# Patient Record
Sex: Female | Born: 1935
Health system: Southern US, Community
[De-identification: ages and names within clinical notes are randomized; demographics above are authoritative.]

## PROBLEM LIST (undated history)

## (undated) DIAGNOSIS — K219 Gastro-esophageal reflux disease without esophagitis: Secondary | ICD-10-CM

## (undated) DIAGNOSIS — T7840XA Allergy, unspecified, initial encounter: Secondary | ICD-10-CM

## (undated) HISTORY — PX: ABDOMINAL HYSTERECTOMY: SHX81

## (undated) HISTORY — PX: ESOPHAGEAL DILATION: SHX303

## (undated) HISTORY — PX: OTHER SURGICAL HISTORY: SHX169

## (undated) HISTORY — DX: Allergy, unspecified, initial encounter: T78.40XA

## (undated) HISTORY — PX: TONSILLECTOMY: SUR1361

---
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
## 2004-08-30 ENCOUNTER — Ambulatory Visit: Payer: Self-pay | Admitting: Internal Medicine

## 2007-12-10 ENCOUNTER — Ambulatory Visit: Payer: Self-pay | Admitting: Gastroenterology

## 2008-03-22 ENCOUNTER — Ambulatory Visit: Payer: Self-pay | Admitting: Internal Medicine

## 2008-04-14 ENCOUNTER — Ambulatory Visit: Payer: Self-pay | Admitting: Gastroenterology

## 2008-09-30 ENCOUNTER — Ambulatory Visit: Payer: Self-pay | Admitting: Family Medicine

## 2010-04-22 ENCOUNTER — Ambulatory Visit: Payer: Self-pay | Admitting: Family Medicine

## 2010-06-23 ENCOUNTER — Ambulatory Visit: Payer: Self-pay | Admitting: Unknown Physician Specialty

## 2010-10-21 ENCOUNTER — Ambulatory Visit: Admit: 2010-10-21 | Payer: Self-pay | Admitting: Internal Medicine

## 2010-11-06 ENCOUNTER — Ambulatory Visit: Payer: Self-pay | Admitting: Internal Medicine

## 2011-09-16 ENCOUNTER — Ambulatory Visit: Payer: Self-pay

## 2011-09-20 ENCOUNTER — Ambulatory Visit: Payer: Self-pay | Admitting: Internal Medicine

## 2011-10-12 DIAGNOSIS — L820 Inflamed seborrheic keratosis: Secondary | ICD-10-CM | POA: Diagnosis not present

## 2011-10-12 DIAGNOSIS — D485 Neoplasm of uncertain behavior of skin: Secondary | ICD-10-CM | POA: Diagnosis not present

## 2011-11-22 DIAGNOSIS — R944 Abnormal results of kidney function studies: Secondary | ICD-10-CM | POA: Diagnosis not present

## 2011-11-22 DIAGNOSIS — D649 Anemia, unspecified: Secondary | ICD-10-CM | POA: Diagnosis not present

## 2012-04-08 ENCOUNTER — Ambulatory Visit: Payer: Self-pay

## 2012-04-08 DIAGNOSIS — R21 Rash and other nonspecific skin eruption: Secondary | ICD-10-CM | POA: Diagnosis not present

## 2012-04-08 DIAGNOSIS — Z9889 Other specified postprocedural states: Secondary | ICD-10-CM | POA: Diagnosis not present

## 2012-04-08 DIAGNOSIS — K219 Gastro-esophageal reflux disease without esophagitis: Secondary | ICD-10-CM | POA: Diagnosis not present

## 2012-04-08 DIAGNOSIS — W57XXXA Bitten or stung by nonvenomous insect and other nonvenomous arthropods, initial encounter: Secondary | ICD-10-CM | POA: Diagnosis not present

## 2012-04-08 DIAGNOSIS — Z79899 Other long term (current) drug therapy: Secondary | ICD-10-CM | POA: Diagnosis not present

## 2012-04-08 LAB — CBC WITH DIFFERENTIAL/PLATELET
Eosinophil #: 0.2 10*3/uL (ref 0.0–0.7)
Lymphocyte #: 1.2 10*3/uL (ref 1.0–3.6)
Lymphocyte %: 34.6 %
MCHC: 33.2 g/dL (ref 32.0–36.0)
MCV: 93 fL (ref 80–100)
Monocyte #: 0.4 x10 3/mm (ref 0.2–0.9)
Monocyte %: 11.9 %
Neutrophil #: 1.7 10*3/uL (ref 1.4–6.5)
Neutrophil %: 48.1 %
Platelet: 157 10*3/uL (ref 150–440)
RDW: 13.7 % (ref 11.5–14.5)
WBC: 3.5 10*3/uL — ABNORMAL LOW (ref 3.6–11.0)

## 2012-06-20 DIAGNOSIS — R799 Abnormal finding of blood chemistry, unspecified: Secondary | ICD-10-CM | POA: Diagnosis not present

## 2012-06-20 DIAGNOSIS — N760 Acute vaginitis: Secondary | ICD-10-CM | POA: Diagnosis not present

## 2012-06-20 DIAGNOSIS — Z1239 Encounter for other screening for malignant neoplasm of breast: Secondary | ICD-10-CM | POA: Diagnosis not present

## 2012-06-20 DIAGNOSIS — R109 Unspecified abdominal pain: Secondary | ICD-10-CM | POA: Diagnosis not present

## 2012-06-23 ENCOUNTER — Ambulatory Visit: Payer: Self-pay | Admitting: Internal Medicine

## 2012-06-23 DIAGNOSIS — L298 Other pruritus: Secondary | ICD-10-CM | POA: Diagnosis not present

## 2012-06-27 ENCOUNTER — Ambulatory Visit: Payer: Self-pay | Admitting: Family Medicine

## 2012-06-27 DIAGNOSIS — Z1231 Encounter for screening mammogram for malignant neoplasm of breast: Secondary | ICD-10-CM | POA: Diagnosis not present

## 2012-06-27 DIAGNOSIS — R928 Other abnormal and inconclusive findings on diagnostic imaging of breast: Secondary | ICD-10-CM | POA: Diagnosis not present

## 2012-07-09 DIAGNOSIS — R109 Unspecified abdominal pain: Secondary | ICD-10-CM | POA: Diagnosis not present

## 2012-10-22 ENCOUNTER — Ambulatory Visit: Payer: Self-pay | Admitting: Emergency Medicine

## 2012-10-22 DIAGNOSIS — Z9889 Other specified postprocedural states: Secondary | ICD-10-CM | POA: Diagnosis not present

## 2012-10-22 DIAGNOSIS — J069 Acute upper respiratory infection, unspecified: Secondary | ICD-10-CM | POA: Diagnosis not present

## 2012-10-22 DIAGNOSIS — R05 Cough: Secondary | ICD-10-CM | POA: Diagnosis not present

## 2012-10-22 DIAGNOSIS — K219 Gastro-esophageal reflux disease without esophagitis: Secondary | ICD-10-CM | POA: Diagnosis not present

## 2012-10-22 DIAGNOSIS — Z79899 Other long term (current) drug therapy: Secondary | ICD-10-CM | POA: Diagnosis not present

## 2013-05-28 DIAGNOSIS — H26019 Infantile and juvenile cortical, lamellar, or zonular cataract, unspecified eye: Secondary | ICD-10-CM | POA: Diagnosis not present

## 2013-06-04 ENCOUNTER — Ambulatory Visit: Payer: Self-pay

## 2013-06-04 DIAGNOSIS — K219 Gastro-esophageal reflux disease without esophagitis: Secondary | ICD-10-CM | POA: Diagnosis not present

## 2013-06-04 DIAGNOSIS — J40 Bronchitis, not specified as acute or chronic: Secondary | ICD-10-CM | POA: Diagnosis not present

## 2013-06-04 DIAGNOSIS — J309 Allergic rhinitis, unspecified: Secondary | ICD-10-CM | POA: Diagnosis not present

## 2013-06-04 DIAGNOSIS — Z79899 Other long term (current) drug therapy: Secondary | ICD-10-CM | POA: Diagnosis not present

## 2013-06-04 DIAGNOSIS — J069 Acute upper respiratory infection, unspecified: Secondary | ICD-10-CM | POA: Diagnosis not present

## 2013-06-04 DIAGNOSIS — Z9089 Acquired absence of other organs: Secondary | ICD-10-CM | POA: Diagnosis not present

## 2013-08-20 DIAGNOSIS — Z13220 Encounter for screening for lipoid disorders: Secondary | ICD-10-CM | POA: Diagnosis not present

## 2013-08-20 DIAGNOSIS — Z79899 Other long term (current) drug therapy: Secondary | ICD-10-CM | POA: Diagnosis not present

## 2013-08-20 DIAGNOSIS — Z1239 Encounter for other screening for malignant neoplasm of breast: Secondary | ICD-10-CM | POA: Diagnosis not present

## 2013-08-20 DIAGNOSIS — Z780 Asymptomatic menopausal state: Secondary | ICD-10-CM | POA: Diagnosis not present

## 2013-08-20 DIAGNOSIS — K219 Gastro-esophageal reflux disease without esophagitis: Secondary | ICD-10-CM | POA: Diagnosis not present

## 2013-08-20 DIAGNOSIS — Z0000 Encounter for general adult medical examination without abnormal findings: Secondary | ICD-10-CM | POA: Diagnosis not present

## 2013-08-23 DIAGNOSIS — E875 Hyperkalemia: Secondary | ICD-10-CM | POA: Diagnosis not present

## 2013-09-03 ENCOUNTER — Ambulatory Visit: Payer: Self-pay | Admitting: Family Medicine

## 2013-09-03 DIAGNOSIS — Z1231 Encounter for screening mammogram for malignant neoplasm of breast: Secondary | ICD-10-CM | POA: Diagnosis not present

## 2013-10-22 DIAGNOSIS — R7989 Other specified abnormal findings of blood chemistry: Secondary | ICD-10-CM | POA: Diagnosis not present

## 2013-11-05 DIAGNOSIS — R1013 Epigastric pain: Secondary | ICD-10-CM | POA: Diagnosis not present

## 2014-01-07 DIAGNOSIS — K3189 Other diseases of stomach and duodenum: Secondary | ICD-10-CM | POA: Diagnosis not present

## 2014-04-21 DIAGNOSIS — R7989 Other specified abnormal findings of blood chemistry: Secondary | ICD-10-CM | POA: Diagnosis not present

## 2014-06-13 DIAGNOSIS — M899 Disorder of bone, unspecified: Secondary | ICD-10-CM | POA: Diagnosis not present

## 2014-06-13 DIAGNOSIS — N760 Acute vaginitis: Secondary | ICD-10-CM | POA: Diagnosis not present

## 2014-06-13 DIAGNOSIS — H73819 Atrophic flaccid tympanic membrane, unspecified ear: Secondary | ICD-10-CM | POA: Diagnosis not present

## 2014-06-13 DIAGNOSIS — M949 Disorder of cartilage, unspecified: Secondary | ICD-10-CM | POA: Diagnosis not present

## 2014-06-13 DIAGNOSIS — N189 Chronic kidney disease, unspecified: Secondary | ICD-10-CM | POA: Diagnosis not present

## 2014-06-13 DIAGNOSIS — J309 Allergic rhinitis, unspecified: Secondary | ICD-10-CM | POA: Diagnosis not present

## 2014-06-13 DIAGNOSIS — R300 Dysuria: Secondary | ICD-10-CM | POA: Diagnosis not present

## 2014-06-23 DIAGNOSIS — N952 Postmenopausal atrophic vaginitis: Secondary | ICD-10-CM | POA: Diagnosis not present

## 2014-06-23 DIAGNOSIS — N189 Chronic kidney disease, unspecified: Secondary | ICD-10-CM | POA: Diagnosis not present

## 2014-06-23 DIAGNOSIS — M899 Disorder of bone, unspecified: Secondary | ICD-10-CM | POA: Diagnosis not present

## 2014-06-23 DIAGNOSIS — E559 Vitamin D deficiency, unspecified: Secondary | ICD-10-CM | POA: Diagnosis not present

## 2014-06-23 DIAGNOSIS — B373 Candidiasis of vulva and vagina: Secondary | ICD-10-CM | POA: Diagnosis not present

## 2014-06-23 DIAGNOSIS — M949 Disorder of cartilage, unspecified: Secondary | ICD-10-CM | POA: Diagnosis not present

## 2014-06-23 DIAGNOSIS — D518 Other vitamin B12 deficiency anemias: Secondary | ICD-10-CM | POA: Diagnosis not present

## 2014-06-23 DIAGNOSIS — D649 Anemia, unspecified: Secondary | ICD-10-CM | POA: Diagnosis not present

## 2014-06-23 DIAGNOSIS — Z0000 Encounter for general adult medical examination without abnormal findings: Secondary | ICD-10-CM | POA: Diagnosis not present

## 2014-06-23 DIAGNOSIS — B3731 Acute candidiasis of vulva and vagina: Secondary | ICD-10-CM | POA: Diagnosis not present

## 2014-07-16 DIAGNOSIS — E2839 Other primary ovarian failure: Secondary | ICD-10-CM | POA: Diagnosis not present

## 2014-07-16 DIAGNOSIS — M899 Disorder of bone, unspecified: Secondary | ICD-10-CM | POA: Diagnosis not present

## 2014-07-16 DIAGNOSIS — M949 Disorder of cartilage, unspecified: Secondary | ICD-10-CM | POA: Diagnosis not present

## 2014-08-13 DIAGNOSIS — H25019 Cortical age-related cataract, unspecified eye: Secondary | ICD-10-CM | POA: Diagnosis not present

## 2014-09-12 ENCOUNTER — Ambulatory Visit: Payer: Self-pay | Admitting: Family Medicine

## 2014-09-12 DIAGNOSIS — Z885 Allergy status to narcotic agent status: Secondary | ICD-10-CM | POA: Diagnosis not present

## 2014-09-12 DIAGNOSIS — Z884 Allergy status to anesthetic agent status: Secondary | ICD-10-CM | POA: Diagnosis not present

## 2014-09-12 DIAGNOSIS — Z79899 Other long term (current) drug therapy: Secondary | ICD-10-CM | POA: Diagnosis not present

## 2014-09-12 DIAGNOSIS — Z883 Allergy status to other anti-infective agents status: Secondary | ICD-10-CM | POA: Diagnosis not present

## 2014-09-12 DIAGNOSIS — J309 Allergic rhinitis, unspecified: Secondary | ICD-10-CM | POA: Diagnosis not present

## 2014-09-12 DIAGNOSIS — K210 Gastro-esophageal reflux disease with esophagitis: Secondary | ICD-10-CM | POA: Diagnosis not present

## 2014-09-12 DIAGNOSIS — Z8709 Personal history of other diseases of the respiratory system: Secondary | ICD-10-CM | POA: Diagnosis not present

## 2014-09-12 DIAGNOSIS — Z9889 Other specified postprocedural states: Secondary | ICD-10-CM | POA: Diagnosis not present

## 2014-10-06 ENCOUNTER — Ambulatory Visit: Payer: Self-pay | Admitting: Physician Assistant

## 2014-10-06 DIAGNOSIS — J309 Allergic rhinitis, unspecified: Secondary | ICD-10-CM | POA: Diagnosis not present

## 2014-10-06 DIAGNOSIS — M810 Age-related osteoporosis without current pathological fracture: Secondary | ICD-10-CM | POA: Diagnosis not present

## 2014-10-06 DIAGNOSIS — Z1231 Encounter for screening mammogram for malignant neoplasm of breast: Secondary | ICD-10-CM | POA: Diagnosis not present

## 2014-10-06 DIAGNOSIS — D631 Anemia in chronic kidney disease: Secondary | ICD-10-CM | POA: Diagnosis not present

## 2014-10-06 DIAGNOSIS — B373 Candidiasis of vulva and vagina: Secondary | ICD-10-CM | POA: Diagnosis not present

## 2015-01-13 DIAGNOSIS — E559 Vitamin D deficiency, unspecified: Secondary | ICD-10-CM | POA: Diagnosis not present

## 2015-01-13 DIAGNOSIS — N189 Chronic kidney disease, unspecified: Secondary | ICD-10-CM | POA: Diagnosis not present

## 2015-01-13 DIAGNOSIS — J309 Allergic rhinitis, unspecified: Secondary | ICD-10-CM | POA: Diagnosis not present

## 2015-01-13 DIAGNOSIS — S134XXA Sprain of ligaments of cervical spine, initial encounter: Secondary | ICD-10-CM | POA: Diagnosis not present

## 2015-01-13 DIAGNOSIS — R1013 Epigastric pain: Secondary | ICD-10-CM | POA: Diagnosis not present

## 2015-01-13 DIAGNOSIS — N771 Vaginitis, vulvitis and vulvovaginitis in diseases classified elsewhere: Secondary | ICD-10-CM | POA: Diagnosis not present

## 2015-01-13 DIAGNOSIS — M810 Age-related osteoporosis without current pathological fracture: Secondary | ICD-10-CM | POA: Diagnosis not present

## 2015-01-13 DIAGNOSIS — N952 Postmenopausal atrophic vaginitis: Secondary | ICD-10-CM | POA: Diagnosis not present

## 2015-02-13 DIAGNOSIS — N952 Postmenopausal atrophic vaginitis: Secondary | ICD-10-CM | POA: Diagnosis not present

## 2015-02-13 DIAGNOSIS — B373 Candidiasis of vulva and vagina: Secondary | ICD-10-CM | POA: Diagnosis not present

## 2015-02-13 DIAGNOSIS — N183 Chronic kidney disease, stage 3 (moderate): Secondary | ICD-10-CM | POA: Diagnosis not present

## 2015-03-05 DIAGNOSIS — N183 Chronic kidney disease, stage 3 (moderate): Secondary | ICD-10-CM | POA: Diagnosis not present

## 2015-04-02 DIAGNOSIS — N8320 Unspecified ovarian cysts: Secondary | ICD-10-CM | POA: Diagnosis not present

## 2015-05-11 DIAGNOSIS — Z0001 Encounter for general adult medical examination with abnormal findings: Secondary | ICD-10-CM | POA: Diagnosis not present

## 2015-05-11 DIAGNOSIS — B373 Candidiasis of vulva and vagina: Secondary | ICD-10-CM | POA: Diagnosis not present

## 2015-05-11 DIAGNOSIS — N952 Postmenopausal atrophic vaginitis: Secondary | ICD-10-CM | POA: Diagnosis not present

## 2015-05-11 DIAGNOSIS — N8320 Unspecified ovarian cysts: Secondary | ICD-10-CM | POA: Diagnosis not present

## 2015-05-11 DIAGNOSIS — N183 Chronic kidney disease, stage 3 (moderate): Secondary | ICD-10-CM | POA: Diagnosis not present

## 2015-05-11 DIAGNOSIS — M810 Age-related osteoporosis without current pathological fracture: Secondary | ICD-10-CM | POA: Diagnosis not present

## 2015-05-27 DIAGNOSIS — N8320 Unspecified ovarian cysts: Secondary | ICD-10-CM | POA: Diagnosis not present

## 2015-06-15 DIAGNOSIS — N183 Chronic kidney disease, stage 3 (moderate): Secondary | ICD-10-CM | POA: Diagnosis not present

## 2015-06-15 DIAGNOSIS — N8320 Unspecified ovarian cysts: Secondary | ICD-10-CM | POA: Diagnosis not present

## 2015-06-15 DIAGNOSIS — D631 Anemia in chronic kidney disease: Secondary | ICD-10-CM | POA: Diagnosis not present

## 2015-07-29 DIAGNOSIS — D559 Anemia due to enzyme disorder, unspecified: Secondary | ICD-10-CM | POA: Diagnosis not present

## 2015-07-29 DIAGNOSIS — D631 Anemia in chronic kidney disease: Secondary | ICD-10-CM | POA: Diagnosis not present

## 2015-07-29 DIAGNOSIS — Z0001 Encounter for general adult medical examination with abnormal findings: Secondary | ICD-10-CM | POA: Diagnosis not present

## 2015-07-29 DIAGNOSIS — N183 Chronic kidney disease, stage 3 (moderate): Secondary | ICD-10-CM | POA: Diagnosis not present

## 2015-07-29 DIAGNOSIS — E0781 Sick-euthyroid syndrome: Secondary | ICD-10-CM | POA: Diagnosis not present

## 2015-07-29 DIAGNOSIS — E756 Lipid storage disorder, unspecified: Secondary | ICD-10-CM | POA: Diagnosis not present

## 2015-07-29 DIAGNOSIS — E079 Disorder of thyroid, unspecified: Secondary | ICD-10-CM | POA: Diagnosis not present

## 2015-07-29 DIAGNOSIS — E538 Deficiency of other specified B group vitamins: Secondary | ICD-10-CM | POA: Diagnosis not present

## 2015-09-24 ENCOUNTER — Ambulatory Visit
Admission: EM | Admit: 2015-09-24 | Discharge: 2015-09-24 | Disposition: A | Discharge status: Home / Self Care | Payer: Medicare Other | Attending: Family Medicine | Admitting: Family Medicine

## 2015-09-24 DIAGNOSIS — R05 Cough: Secondary | ICD-10-CM | POA: Diagnosis not present

## 2015-09-24 DIAGNOSIS — R059 Cough, unspecified: Secondary | ICD-10-CM

## 2015-09-24 DIAGNOSIS — H00013 Hordeolum externum right eye, unspecified eyelid: Secondary | ICD-10-CM | POA: Diagnosis not present

## 2015-09-24 DIAGNOSIS — J019 Acute sinusitis, unspecified: Secondary | ICD-10-CM | POA: Diagnosis not present

## 2015-09-24 HISTORY — DX: Gastro-esophageal reflux disease without esophagitis: K21.9

## 2015-09-24 MED ORDER — CEFUROXIME AXETIL 250 MG PO TABS: 250.0000 mg | ORAL_TABLET | Freq: Two times a day (BID) | ORAL | Status: DC

## 2015-09-24 MED ORDER — HYDROCOD POLST-CPM POLST ER 10-8 MG/5ML PO SUER: 5.0000 mL | Freq: Two times a day (BID) | ORAL | Status: DC | PRN

## 2015-09-24 NOTE — Discharge Instructions (Signed)
Cough, Adult A cough helps to clear your throat and lungs. A cough may last only 2-3 weeks (acute), or it may last longer than 8 weeks (chronic). Many different things can cause a cough. A cough may be a sign of an illness or another medical condition. HOME CARE  Pay attention to any changes in your cough.  Take medicines only as told by your doctor.  If you were prescribed an antibiotic medicine, take it as told by your doctor. Do not stop taking it even if you start to feel better.  Talk with your doctor before you try using a cough medicine.  Drink enough fluid to keep your pee (urine) clear or pale yellow.  If the air is dry, use a cold steam vaporizer or humidifier in your home.  Stay away from things that make you cough at work or at home.  If your cough is worse at night, try using extra pillows to raise your head up higher while you sleep.  Do not smoke, and try not to be around smoke. If you need help quitting, ask your doctor.  Do not have caffeine.  Do not drink alcohol.  Rest as needed. GET HELP IF:  You have new problems (symptoms).  You cough up yellow fluid (pus).  Your cough does not get better after 2-3 weeks, or your cough gets worse.  Medicine does not help your cough and you are not sleeping well.  You have pain that gets worse or pain that is not helped with medicine.  You have a fever.  You are losing weight and you do not know why.  You have night sweats. GET HELP RIGHT AWAY IF:  You cough up blood.  You have trouble breathing.  Your heartbeat is very fast.   This information is not intended to replace advice given to you by your health care provider. Make sure you discuss any questions you have with your health care provider.   Document Released: 06/02/2011 Document Revised: 06/10/2015 Document Reviewed: 11/26/2014 Elsevier Interactive Patient Education 2016 [place] is a bump on your eyelid caused by a bacterial  infection. A stye can form inside the eyelid (internal stye) or outside the eyelid (external stye). An internal stye may be caused by an infected oil-producing gland inside your eyelid. An external stye may be caused by an infection at the base of your eyelash (hair follicle). Styes are very common. Anyone can get them at any age. They usually occur in just one eye, but you may have more than one in either eye.  CAUSES  The infection is almost always caused by bacteria called Staphylococcus aureus. This is a common type of bacteria that lives on your skin. RISK FACTORS You may be at higher risk for a stye if you have had one before. You may also be at higher risk if you have:  Diabetes.  Long-term illness.  Long-term eye redness.  A skin condition called seborrhea.  High fat levels in your blood (lipids). SIGNS AND SYMPTOMS  Eyelid pain is the most common symptom of a stye. Internal styes are more painful than external styes. Other signs and symptoms may include:  Painful swelling of your eyelid.  A scratchy feeling in your eye.  Tearing and redness of your eye.  Pus draining from the stye. DIAGNOSIS  Your health care provider may be able to diagnose a stye just by examining your eye. The health care provider may also check to  make sure:  You do not have a fever or other signs of a more serious infection.  The infection has not spread to other parts of your eye or areas around your eye. TREATMENT  Most styes will clear up in a few days without treatment. In some cases, you may need to use antibiotic drops or ointment to prevent infection. Your health care provider may have to drain the stye surgically if your stye is:  Large.  Causing a lot of pain.  Interfering with your vision. This can be done using a thin blade or a needle.  HOME CARE INSTRUCTIONS   Take medicines only as directed by your health care provider.  Apply a clean, warm compress to your eye for 10 minutes,  4 times a day.  Do not wear contact lenses or eye makeup until your stye has healed.  Do not try to pop or drain the stye. SEEK MEDICAL CARE IF:  You have chills or a fever.  Your stye does not go away after several days.  Your stye affects your vision.  Your eyeball becomes swollen, red, or painful. MAKE SURE YOU:  Understand these instructions.  Will watch your condition.  Will get help right away if you are not doing well or get worse.   This information is not intended to replace advice given to you by your health care provider. Make sure you discuss any questions you have with your health care provider.   Document Released: 06/29/2005 Document Revised: 10/10/2014 Document Reviewed: 01/03/2014 Elsevier Interactive Patient Education 2016 [employer].  Sinusitis, Adult Sinusitis is redness, soreness, and puffiness (inflammation) of the air pockets in the bones of your face (sinuses). The redness, soreness, and puffiness can cause air and mucus to get trapped in your sinuses. This can allow germs to grow and cause an infection.  HOME CARE   Drink enough fluids to keep your pee (urine) clear or pale yellow.  Use a humidifier in your home.  Run a hot shower to create steam in the bathroom. Sit in the bathroom with the door closed. Breathe in the steam 3-4 times a day.  Put a warm, moist washcloth on your face 3-4 times a day, or as told by your doctor.  Use salt water sprays (saline sprays) to wet the thick fluid in your nose. This can help the sinuses drain.  Only take medicine as told by your doctor. GET HELP RIGHT AWAY IF:   Your pain gets worse.  You have very bad headaches.  You are sick to your stomach (nauseous).  You throw up (vomit).  You are very sleepy (drowsy) all the time.  Your face is puffy (swollen).  Your vision changes.  You have a stiff neck.  You have trouble breathing. MAKE SURE YOU:   Understand these instructions.  Will watch  your condition.  Will get help right away if you are not doing well or get worse.   This information is not intended to replace advice given to you by your health care provider. Make sure you discuss any questions you have with your health care provider.   Document Released: 03/07/2008 Document Revised: 10/10/2014 Document Reviewed: 04/24/2012 Elsevier Interactive Patient Education [employer].

## 2015-09-24 NOTE — ED Provider Notes (Signed)
CSN: [id number]     Arrival date & time 09/24/15  [id number] History   First MD Initiated Contact with Patient 09/24/15 [phone]    Nurses notes were reviewed. Chief Complaint  Patient presents with  . URI   Patient is here because of nasal drainage irritation from coughing. She states for the last 3 days or since Tuesday she's noticed coughing and increased drainage and keeping people up at night. States she's afraid is going get to her chest and she is divorced bronchitis easily.  The other concern is that she's had irritation or stye in her lower right eyelid for about 2 weeks and has not responded Neosporin ointment and other treatment modalities that she's used.    (Consider location/radiation/quality/duration/timing/severity/associated sxs/prior Treatment) Patient is a 81 y.o. female presenting with URI. The history is provided by the patient. No language interpreter was used.  URI Presenting symptoms: congestion, cough and facial pain   Severity:  Moderate Onset quality:  Sudden Timing:  Constant Progression:  Worsening Chronicity:  New Relieved by:  Decongestant Worsened by:  Nothing tried Ineffective treatments:  None tried Associated symptoms: sinus pain and sneezing   Associated symptoms: no neck pain   Risk factors: being elderly and chronic cardiac disease   Risk factors: no chronic kidney disease, no chronic respiratory disease, no recent illness and no recent travel     Past Medical History  Diagnosis Date  . GERD (gastroesophageal reflux disease)    Past Surgical History  Procedure Laterality Date  . Abdominal hysterectomy    . Tonsillectomy    . Esophageal dilation     Family History  Problem Relation Age of Onset  . Stroke Father    Social History  Substance Use Topics  . Smoking status: Never Smoker   . Smokeless tobacco: None  . Alcohol Use: No   OB History    No data available     Review of Systems  HENT: Positive for congestion and sneezing.     Respiratory: Positive for cough.   Musculoskeletal: Negative for neck pain.  Skin: Negative.   Psychiatric/Behavioral: Negative.   All other systems reviewed and are negative.   Allergies  Codeine  Home Medications   Prior to Admission medications   Medication Sig Start Date End Date Taking? Authorizing Provider  cetirizine (ZYRTEC) 10 MG tablet Take 10 mg by mouth daily.   Yes Historical Provider, MD  esomeprazole (NEXIUM) 40 MG capsule Take 40 mg by mouth daily at 12 noon.   Yes Historical Provider, MD  montelukast (SINGULAIR) 10 MG tablet Take 10 mg by mouth at bedtime.   Yes Historical Provider, MD  cefUROXime (CEFTIN) 250 MG tablet Take 1 tablet (250 mg total) by mouth 2 (two) times daily. 09/24/15   Frederich Cha, MD  chlorpheniramine-HYDROcodone [facility]) 10-8 MG/5ML SUER Take 5 mLs by mouth every 12 (twelve) hours as needed for cough. 09/24/15   Frederich Cha, MD   Meds Ordered and Administered this Visit  Medications - No data to display  BP 129/77 mmHg  Pulse 80  Temp(Src) 96.3 F (35.7 C) (Tympanic)  Resp 17  Ht 5\' 7"  (1.702 m)  Wt 159 lb (72.122 kg)  BMI 24.90 kg/m2  SpO2 100% No data found.   Physical Exam  Constitutional: She is oriented to person, place, and time. She appears well-developed and well-nourished.  HENT:  Head: Normocephalic and atraumatic.  Right Ear: Hearing, tympanic membrane, external ear and ear canal normal.  Left Ear:  Hearing, tympanic membrane, external ear and ear canal normal.  Nose: Mucosal edema and rhinorrhea present. No nasal deformity or septal deviation. Right sinus exhibits maxillary sinus tenderness. Right sinus exhibits no frontal sinus tenderness. Left sinus exhibits maxillary sinus tenderness. Left sinus exhibits no frontal sinus tenderness.  Mouth/Throat: Uvula is midline and oropharynx is clear and moist. No posterior oropharyngeal edema or posterior oropharyngeal erythema.  Eyes: Conjunctivae are normal.  Pupils are equal, round, and reactive to light.    Styes present in the right lower eyelid and swollen red and inflamed  Neck: Normal range of motion. Neck supple. No tracheal deviation present.  Cardiovascular: Normal rate and regular rhythm.   Pulmonary/Chest: Effort normal and breath sounds normal.  Lymphadenopathy:    She has cervical adenopathy.  Neurological: She is alert and oriented to person, place, and time. No cranial nerve deficit.  Skin: Skin is warm and dry. No erythema.  Psychiatric: She has a normal mood and affect. Her behavior is normal.  Vitals reviewed.   ED Course  Procedures (including critical care time)  Labs Review Labs Reviewed - No data to display  Imaging Review No results found.   Visual Acuity Review  Right Eye Distance:   Left Eye Distance:   Bilateral Distance:    Right Eye Near:   Left Eye Near:    Bilateral Near:         MDM   1. Sty, right   2. Acute sinusitis, recurrence not specified, unspecified location   3. Cough    Discussed with patient  #1 problem cough and congestion. Probably early sinus infection. Explained to patient that normally would not place her on anabiotic for this early sinus infection however I am concerned his for age and problem below. We'll have her continue using decongestants and allergy medicines that she has prescribed.  #2 For the cough I was considering Ladona Ridgel but she informed me that a previous provider here Mr. Lynwood Dawley gave her generic Tussionex and that seems be the only think will help her cough when he hits. Will give her a prescription for that as well.  #3 because the sty we'll place on Ceftin 250 twice a day and also using to treat sinus infection as well in an elderly female.  Assets ear physician a week if not better.   Frederich Cha, MD 09/24/15 1009

## 2015-09-24 NOTE — ED Notes (Signed)
Started 4 days ago with runny nose and now with productive yellow cough. Denies fever

## 2015-10-04 ENCOUNTER — Ambulatory Visit
Admission: EM | Admit: 2015-10-04 | Discharge: 2015-10-04 | Disposition: A | Discharge status: Home / Self Care | Payer: Medicare Other | Attending: Family Medicine | Admitting: Family Medicine

## 2015-10-04 ENCOUNTER — Encounter: Payer: Self-pay | Admitting: Gynecology

## 2015-10-04 DIAGNOSIS — R05 Cough: Secondary | ICD-10-CM | POA: Diagnosis not present

## 2015-10-04 DIAGNOSIS — R0982 Postnasal drip: Secondary | ICD-10-CM

## 2015-10-04 DIAGNOSIS — R059 Cough, unspecified: Secondary | ICD-10-CM

## 2015-10-04 MED ORDER — PREDNISONE 10 MG PO TABS: ORAL_TABLET | ORAL | Status: DC

## 2015-10-04 NOTE — ED Notes (Signed)
Patient stated seen on 09/24/15  For non productive cough and still not feeling better.

## 2015-10-04 NOTE — ED Provider Notes (Signed)
Patient presents today with symptoms of increased clear mucus, postnasal drip. Patient states that she was seen here about 10 days ago and was put on an antibiotic for sinusitis. She states that the colored mucus is gone and sinus pressure is gone. She still has some mild cough but no fever. She denies any chest pain or shortness of breath. She is taking Zyrtec and prescription cough medicine. She denies any chest pain, shortness of breath, nausea, vomiting, diarrhea, severe headache. She denies any history of asthma or COPD.  ROS: Negative except mentioned above.  Vitals as per Epic GENERAL: NAD HEENT: mild pharyngeal erythema, no exudate, thick clear mucus noted in back of throat, no erythema of TMs, no cervical LAD RESP: CTA B CARD: RRR  A/P: Postnasal Drip, Cough- Would not recommend another course of antibiotics at this time. Patient can switch her cough medicine to Delsym since the test next is making her drowsy. She can continue taking her Zyrtec and warm salt water gargles prn. I will place her on a short course of oral Prednisone. She'll not take ibuprofen while on the prednisone but can take Tylenol if needed. She is to seek medical attention if symptoms persist or worsen as discussed.    Paulina Fusi, MD 10/04/15 1125

## 2015-10-15 DIAGNOSIS — J069 Acute upper respiratory infection, unspecified: Secondary | ICD-10-CM | POA: Diagnosis not present

## 2015-10-15 DIAGNOSIS — R05 Cough: Secondary | ICD-10-CM | POA: Diagnosis not present

## 2015-10-15 DIAGNOSIS — R062 Wheezing: Secondary | ICD-10-CM | POA: Diagnosis not present

## 2015-11-11 ENCOUNTER — Ambulatory Visit
Admission: RE | Admit: 2015-11-11 | Discharge: 2015-11-11 | Disposition: A | Discharge status: Home / Self Care | Payer: Medicare Other | Source: Ambulatory Visit | Attending: Physician Assistant | Admitting: Physician Assistant

## 2015-11-11 ENCOUNTER — Other Ambulatory Visit: Payer: Self-pay | Admitting: Physician Assistant

## 2015-11-11 DIAGNOSIS — R059 Cough, unspecified: Secondary | ICD-10-CM

## 2015-11-11 DIAGNOSIS — D631 Anemia in chronic kidney disease: Secondary | ICD-10-CM | POA: Diagnosis not present

## 2015-11-11 DIAGNOSIS — J069 Acute upper respiratory infection, unspecified: Secondary | ICD-10-CM | POA: Diagnosis not present

## 2015-11-11 DIAGNOSIS — N189 Chronic kidney disease, unspecified: Secondary | ICD-10-CM | POA: Diagnosis not present

## 2015-11-11 DIAGNOSIS — R05 Cough: Secondary | ICD-10-CM | POA: Insufficient documentation

## 2015-12-09 DIAGNOSIS — R05 Cough: Secondary | ICD-10-CM | POA: Diagnosis not present

## 2015-12-23 DIAGNOSIS — R1310 Dysphagia, unspecified: Secondary | ICD-10-CM | POA: Diagnosis not present

## 2015-12-23 DIAGNOSIS — J309 Allergic rhinitis, unspecified: Secondary | ICD-10-CM | POA: Diagnosis not present

## 2015-12-23 DIAGNOSIS — J069 Acute upper respiratory infection, unspecified: Secondary | ICD-10-CM | POA: Diagnosis not present

## 2016-02-17 DIAGNOSIS — H2513 Age-related nuclear cataract, bilateral: Secondary | ICD-10-CM | POA: Diagnosis not present

## 2016-03-28 DIAGNOSIS — S60511A Abrasion of right hand, initial encounter: Secondary | ICD-10-CM | POA: Diagnosis not present

## 2016-04-04 DIAGNOSIS — S60511D Abrasion of right hand, subsequent encounter: Secondary | ICD-10-CM | POA: Diagnosis not present

## 2016-04-04 DIAGNOSIS — W01190D Fall on same level from slipping, tripping and stumbling with subsequent striking against furniture, subsequent encounter: Secondary | ICD-10-CM | POA: Diagnosis not present

## 2016-04-08 DIAGNOSIS — H2512 Age-related nuclear cataract, left eye: Secondary | ICD-10-CM | POA: Diagnosis not present

## 2016-04-08 DIAGNOSIS — H25011 Cortical age-related cataract, right eye: Secondary | ICD-10-CM | POA: Diagnosis not present

## 2016-04-08 DIAGNOSIS — H25012 Cortical age-related cataract, left eye: Secondary | ICD-10-CM | POA: Diagnosis not present

## 2016-04-08 DIAGNOSIS — H40033 Anatomical narrow angle, bilateral: Secondary | ICD-10-CM | POA: Diagnosis not present

## 2016-04-08 DIAGNOSIS — H2511 Age-related nuclear cataract, right eye: Secondary | ICD-10-CM | POA: Diagnosis not present

## 2016-05-24 DIAGNOSIS — H25811 Combined forms of age-related cataract, right eye: Secondary | ICD-10-CM | POA: Diagnosis not present

## 2016-05-24 DIAGNOSIS — H25011 Cortical age-related cataract, right eye: Secondary | ICD-10-CM | POA: Diagnosis not present

## 2016-05-24 DIAGNOSIS — H2511 Age-related nuclear cataract, right eye: Secondary | ICD-10-CM | POA: Diagnosis not present

## 2016-06-01 DIAGNOSIS — H2511 Age-related nuclear cataract, right eye: Secondary | ICD-10-CM | POA: Diagnosis not present

## 2016-06-13 DIAGNOSIS — H2512 Age-related nuclear cataract, left eye: Secondary | ICD-10-CM | POA: Diagnosis not present

## 2016-06-13 DIAGNOSIS — H25012 Cortical age-related cataract, left eye: Secondary | ICD-10-CM | POA: Diagnosis not present

## 2016-06-21 DIAGNOSIS — H2512 Age-related nuclear cataract, left eye: Secondary | ICD-10-CM | POA: Diagnosis not present

## 2016-06-21 DIAGNOSIS — H25812 Combined forms of age-related cataract, left eye: Secondary | ICD-10-CM | POA: Diagnosis not present

## 2016-06-21 DIAGNOSIS — H25012 Cortical age-related cataract, left eye: Secondary | ICD-10-CM | POA: Diagnosis not present

## 2016-06-28 DIAGNOSIS — H2512 Age-related nuclear cataract, left eye: Secondary | ICD-10-CM | POA: Diagnosis not present

## 2016-07-11 DIAGNOSIS — E782 Mixed hyperlipidemia: Secondary | ICD-10-CM | POA: Diagnosis not present

## 2016-07-11 DIAGNOSIS — Z0001 Encounter for general adult medical examination with abnormal findings: Secondary | ICD-10-CM | POA: Diagnosis not present

## 2016-07-11 DIAGNOSIS — N189 Chronic kidney disease, unspecified: Secondary | ICD-10-CM | POA: Diagnosis not present

## 2016-07-18 DIAGNOSIS — Z0001 Encounter for general adult medical examination with abnormal findings: Secondary | ICD-10-CM | POA: Diagnosis not present

## 2016-07-18 DIAGNOSIS — E559 Vitamin D deficiency, unspecified: Secondary | ICD-10-CM | POA: Diagnosis not present

## 2016-08-30 DIAGNOSIS — B373 Candidiasis of vulva and vagina: Secondary | ICD-10-CM | POA: Diagnosis not present

## 2016-08-30 DIAGNOSIS — N764 Abscess of vulva: Secondary | ICD-10-CM | POA: Diagnosis not present

## 2016-10-24 DIAGNOSIS — H1045 Other chronic allergic conjunctivitis: Secondary | ICD-10-CM | POA: Diagnosis not present

## 2016-10-24 DIAGNOSIS — Z961 Presence of intraocular lens: Secondary | ICD-10-CM | POA: Diagnosis not present

## 2016-11-28 DIAGNOSIS — J309 Allergic rhinitis, unspecified: Secondary | ICD-10-CM | POA: Diagnosis not present

## 2016-11-28 DIAGNOSIS — K219 Gastro-esophageal reflux disease without esophagitis: Secondary | ICD-10-CM | POA: Diagnosis not present

## 2016-11-28 DIAGNOSIS — N764 Abscess of vulva: Secondary | ICD-10-CM | POA: Diagnosis not present

## 2016-11-28 DIAGNOSIS — R609 Edema, unspecified: Secondary | ICD-10-CM | POA: Diagnosis not present

## 2017-03-27 DIAGNOSIS — B373 Candidiasis of vulva and vagina: Secondary | ICD-10-CM | POA: Diagnosis not present

## 2017-03-27 DIAGNOSIS — K219 Gastro-esophageal reflux disease without esophagitis: Secondary | ICD-10-CM | POA: Diagnosis not present

## 2017-03-27 DIAGNOSIS — J309 Allergic rhinitis, unspecified: Secondary | ICD-10-CM | POA: Diagnosis not present

## 2017-06-12 DIAGNOSIS — H1045 Other chronic allergic conjunctivitis: Secondary | ICD-10-CM | POA: Diagnosis not present

## 2017-06-12 DIAGNOSIS — Z961 Presence of intraocular lens: Secondary | ICD-10-CM | POA: Diagnosis not present

## 2017-07-31 DIAGNOSIS — Z0001 Encounter for general adult medical examination with abnormal findings: Secondary | ICD-10-CM | POA: Diagnosis not present

## 2017-07-31 DIAGNOSIS — E559 Vitamin D deficiency, unspecified: Secondary | ICD-10-CM | POA: Diagnosis not present

## 2017-07-31 DIAGNOSIS — I10 Essential (primary) hypertension: Secondary | ICD-10-CM | POA: Diagnosis not present

## 2017-08-14 DIAGNOSIS — J309 Allergic rhinitis, unspecified: Secondary | ICD-10-CM | POA: Diagnosis not present

## 2017-08-14 DIAGNOSIS — K219 Gastro-esophageal reflux disease without esophagitis: Secondary | ICD-10-CM | POA: Diagnosis not present

## 2017-08-14 DIAGNOSIS — N189 Chronic kidney disease, unspecified: Secondary | ICD-10-CM | POA: Diagnosis not present

## 2017-08-14 DIAGNOSIS — Z0001 Encounter for general adult medical examination with abnormal findings: Secondary | ICD-10-CM | POA: Diagnosis not present

## 2017-08-14 DIAGNOSIS — N390 Urinary tract infection, site not specified: Secondary | ICD-10-CM | POA: Diagnosis not present

## 2017-08-26 ENCOUNTER — Ambulatory Visit
Admission: EM | Admit: 2017-08-26 | Discharge: 2017-08-26 | Disposition: A | Discharge status: Home / Self Care | Payer: Medicare Other | Attending: Orthopedic Surgery | Admitting: Orthopedic Surgery

## 2017-08-26 ENCOUNTER — Other Ambulatory Visit: Payer: Self-pay

## 2017-08-26 ENCOUNTER — Encounter: Payer: Self-pay | Admitting: Gynecology

## 2017-08-26 DIAGNOSIS — J209 Acute bronchitis, unspecified: Secondary | ICD-10-CM

## 2017-08-26 DIAGNOSIS — J0100 Acute maxillary sinusitis, unspecified: Secondary | ICD-10-CM

## 2017-08-26 MED ORDER — DOXYCYCLINE HYCLATE 100 MG PO CAPS
100.0000 mg | ORAL_CAPSULE | Freq: Two times a day (BID) | ORAL | 0 refills | Status: DC
Start: 2017-08-26 — End: 2017-09-13

## 2017-08-26 MED ORDER — HYDROCOD POLST-CPM POLST ER 10-8 MG/5ML PO SUER
5.0000 mL | Freq: Two times a day (BID) | ORAL | 0 refills | Status: DC | PRN
Start: 2017-08-26 — End: 2017-09-13

## 2017-08-26 NOTE — ED Triage Notes (Signed)
Patient c/o cough x 3 weeks. Per patient chest congestion.

## 2017-08-26 NOTE — ED Provider Notes (Signed)
[facility]    CSN: [id number] Arrival date & time: 08/26/17  1423     History   Chief Complaint Chief Complaint  Patient presents with  . Cough    HPI Jill Wilson is a 83 y.o. female presents to the urgent care facility for evaluation of cough and sinus pain.  Patient's had at least 2 weeks of cough, nasal congestion and sinus pain and pressure.  She denies any fevers.  Over the last 3 days symptoms have increased.  She has been taking Zyrtec and cough drops with no improvement.  She has a history of sinus infections.  No abdominal pain, nausea, vomiting, chest pain, shortness of breath.  HPI  Past Medical History:  Diagnosis Date  . GERD (gastroesophageal reflux disease)     There are no active problems to display for this patient.   Past Surgical History:  Procedure Laterality Date  . ABDOMINAL HYSTERECTOMY    . ESOPHAGEAL DILATION    . TONSILLECTOMY      OB History    No data available       Home Medications    Prior to Admission medications   Medication Sig Start Date End Date Taking? Authorizing Provider  cetirizine (ZYRTEC) 10 MG tablet Take 10 mg by mouth daily.   Yes [provider]  esomeprazole (NEXIUM) 40 MG capsule Take 40 mg by mouth daily at 12 noon.   Yes [provider]  montelukast (SINGULAIR) 10 MG tablet Take 10 mg by mouth at bedtime.   Yes [provider]  cefUROXime (CEFTIN) 250 MG tablet Take 1 tablet (250 mg total) by mouth 2 (two) times daily. 09/24/15   Frederich Cha, MD  chlorpheniramine-HYDROcodone [facility] PENNKINETIC ER) 10-8 MG/5ML SUER Take 5 mLs by mouth every 12 (twelve) hours as needed for cough. 08/26/17   Duanne Guess, PA-C  doxycycline (VIBRAMYCIN) 100 MG capsule Take 1 capsule (100 mg total) by mouth 2 (two) times daily. 08/26/17   Duanne Guess, PA-C  predniSONE (DELTASONE) 10 MG tablet Take 3 tablets once daily for 3 days. 10/04/15   Paulina Fusi, MD    Family  History Family History  Problem Relation Age of Onset  . Stroke Father     Social History Social History   Tobacco Use  . Smoking status: Never Smoker  . Smokeless tobacco: Never Used  Substance Use Topics  . Alcohol use: No  . Drug use: No     Allergies   Codeine   Review of Systems Review of Systems  Constitutional: Negative for fever.  HENT: Positive for congestion and rhinorrhea. Negative for ear discharge, sinus pressure, sinus pain, sore throat, trouble swallowing and voice change.   Respiratory: Positive for cough. Negative for shortness of breath, wheezing and stridor.   Cardiovascular: Negative for chest pain.  Gastrointestinal: Negative for abdominal pain, diarrhea, nausea and vomiting.  Genitourinary: Negative for dysuria, flank pain and pelvic pain.  Musculoskeletal: Negative for back pain and myalgias.  Skin: Negative for rash.  Neurological: Negative for dizziness and headaches.     Physical Exam Triage Vital Signs ED Triage Vitals  Enc Vitals Group     BP 08/26/17 1435 103/62     Pulse Rate 08/26/17 1435 90     Resp 08/26/17 1435 16     Temp 08/26/17 1435 98.6 F (37 C)     Temp Source 08/26/17 1435 Oral     SpO2 08/26/17 1435 98 %  Weight 08/26/17 1435 139 lb (63 kg)     Height 08/26/17 1435 5\' 6"  (1.676 m)     Head Circumference --      Peak Flow --      Pain Score 08/26/17 1436 0     Pain Loc --      Pain Edu? --      Excl. in [place]? --    No data found.  Updated Vital Signs BP 103/62 (BP Location: Left Arm)   Pulse 90   Temp 98.6 F (37 C) (Oral)   Resp 16   Ht 5\' 6"  (1.676 m)   Wt 139 lb (63 kg)   SpO2 98%   BMI 22.44 kg/m   Visual Acuity Right Eye Distance:   Left Eye Distance:   Bilateral Distance:    Right Eye Near:   Left Eye Near:    Bilateral Near:     Physical Exam  Constitutional: She is oriented to person, place, and time. She appears well-developed and well-nourished. No distress.  HENT:  Head:  Normocephalic and atraumatic.  Right Ear: Hearing, tympanic membrane, external ear and ear canal normal.  Left Ear: Hearing, tympanic membrane, external ear and ear canal normal.  Nose: Rhinorrhea present.  Mouth/Throat: Mucous membranes are normal. No trismus in the jaw. No uvula swelling. Posterior oropharyngeal erythema present. No oropharyngeal exudate, posterior oropharyngeal edema or tonsillar abscesses. No tonsillar exudate.  Positive maxillary sinus tenderness.  No frontal tenderness.  No pharyngeal erythema or exudates.  Uvula is midline with no shifting.  Eyes: Conjunctivae are normal. Right eye exhibits no discharge. Left eye exhibits no discharge.  Neck: Normal range of motion.  Cardiovascular: Normal rate and regular rhythm.  Pulmonary/Chest: Effort normal and breath sounds normal. No stridor. No respiratory distress. She has no wheezes. She has no rales.  Abdominal: Soft. She exhibits no distension. There is no tenderness.  Musculoskeletal: Normal range of motion. She exhibits no deformity.  Lymphadenopathy:    She has no cervical adenopathy.  Neurological: She is alert and oriented to person, place, and time. She has normal reflexes.  Skin: Skin is warm and dry.  Psychiatric: She has a normal mood and affect. Her behavior is normal. Thought content normal.     UC Treatments / Results  Labs (all labs ordered are listed, but only abnormal results are displayed) Labs Reviewed - No data to display  EKG  EKG Interpretation None       Radiology No results found.  Procedures Procedures (including critical care time)  Medications Ordered in UC Medications - No data to display   Initial Impression / Assessment and Plan / UC Course  I have reviewed the triage vital signs and the nursing notes.  Pertinent labs & imaging results that were available during my care of the patient were reviewed by me and considered in my medical decision making (see chart for  details).     83 year old female with 2 weeks of cough and congestion and sinus pain.  Symptoms increasing over the last few days.  Vital signs today within normal limits.  She is afebrile not with normal oxygen saturations.  Lungs are clear to auscultation with no wheezing rales rhonchi.  She has been without fevers.  Patient with moderate sinus discomfort and tenderness on exam.  We will treat for sinusitis with doxycycline.  She is given a prescription for cough medication.  Recommend patient take over-the-counter guaifenesin.  She will increase fluids.  Final Clinical Impressions(s) /  UC Diagnoses   Final diagnoses:  Acute bronchitis, unspecified organism  Acute non-recurrent maxillary sinusitis    ED Discharge Orders        Ordered    chlorpheniramine-HYDROcodone (TUSSIONEX PENNKINETIC ER) 10-8 MG/5ML SUER  Every 12 hours PRN     08/26/17 1451    doxycycline (VIBRAMYCIN) 100 MG capsule  2 times daily     08/26/17 1451       Controlled Substance Prescriptions [place] Controlled Substance Registry consulted? Yes, I have consulted the [place] Controlled Substances Registry for this patient, and feel the risk/benefit ratio today is favorable for proceeding with this prescription for a controlled substance.   Duanne Guess, PA-C 08/26/17 1455

## 2017-08-26 NOTE — Discharge Instructions (Signed)
Please take medications as prescribed.  Continue to drink lots of fluids.  He may also take over-the-counter guaifenesin.  Return to clinic for any worsening cough, sinus pain, fevers, worsening symptoms or urgent changes in your health.

## 2017-09-13 ENCOUNTER — Ambulatory Visit (INDEPENDENT_AMBULATORY_CARE_PROVIDER_SITE_OTHER): Payer: Medicare Other | Admitting: Internal Medicine

## 2017-09-13 ENCOUNTER — Encounter: Payer: Self-pay | Admitting: Internal Medicine

## 2017-09-13 VITALS — BP 122/62 | HR 76 | Resp 16 | Ht 67.0 in | Wt 143.6 lb

## 2017-09-13 DIAGNOSIS — N342 Other urethritis: Secondary | ICD-10-CM | POA: Insufficient documentation

## 2017-09-13 DIAGNOSIS — I10 Essential (primary) hypertension: Secondary | ICD-10-CM | POA: Insufficient documentation

## 2017-09-13 NOTE — Progress Notes (Signed)
Subjective:     Patient ID: Jill Wilson, female   DOB: July 12, 1931, 83 y.o.   MRN: [id number]  Pt is here for f/u on her UTI. She finished her Levaquin for Ecoli. No blood in her urine. No abdominal pain     Review of Systems  Constitutional: Negative.   HENT: Negative.   Eyes: Negative.   Respiratory: Negative.   Gastrointestinal: Negative.  Negative for blood in stool, diarrhea and nausea.  Endocrine: Negative.   Genitourinary: Positive for flank pain and hematuria.  Musculoskeletal: Positive for arthralgias.  Hematological: Negative.   Psychiatric/Behavioral: Negative.        Objective:   Physical Exam  Constitutional: She appears well-developed and well-nourished.  HENT:  Head: Normocephalic.  Eyes: Pupils are equal, round, and reactive to light.  Neck: Normal range of motion. Neck supple.  Cardiovascular: Normal rate.  Pulmonary/Chest: Breath sounds normal.  Musculoskeletal: Normal range of motion. She exhibits no edema or tenderness.  Skin: Skin is warm and dry.  Psychiatric: She has a normal mood and affect. Her behavior is normal.       Assessment:      Encounter Diagnosis  Name Primary?  . Infective urethritis Yes   Encounter Diagnoses  Name Primary?  . Infective urethritis Yes  . Essential hypertension, benign       Plan:     - repeat u/a with c/s  - order bladder u/s

## 2017-09-15 LAB — URINE CULTURE

## 2017-09-15 LAB — SPECIMEN STATUS REPORT

## 2017-10-09 ENCOUNTER — Other Ambulatory Visit: Payer: Self-pay

## 2017-10-30 DIAGNOSIS — Z961 Presence of intraocular lens: Secondary | ICD-10-CM | POA: Diagnosis not present

## 2017-10-30 DIAGNOSIS — H04123 Dry eye syndrome of bilateral lacrimal glands: Secondary | ICD-10-CM | POA: Diagnosis not present

## 2017-12-11 ENCOUNTER — Encounter: Payer: Self-pay | Admitting: Nurse Practitioner

## 2017-12-11 ENCOUNTER — Ambulatory Visit (INDEPENDENT_AMBULATORY_CARE_PROVIDER_SITE_OTHER): Payer: Medicare Other | Admitting: Nurse Practitioner

## 2017-12-11 VITALS — BP 116/74 | HR 73 | Temp 97.7°F | Resp 16 | Ht 67.0 in | Wt 137.0 lb

## 2017-12-11 DIAGNOSIS — J3089 Other allergic rhinitis: Secondary | ICD-10-CM

## 2017-12-11 DIAGNOSIS — J209 Acute bronchitis, unspecified: Secondary | ICD-10-CM

## 2017-12-11 DIAGNOSIS — I10 Essential (primary) hypertension: Secondary | ICD-10-CM

## 2017-12-11 MED ORDER — LEVOFLOXACIN 500 MG PO TABS: 500.0000 mg | ORAL_TABLET | Freq: Every day | ORAL | 0 refills | Status: DC

## 2017-12-11 NOTE — Progress Notes (Signed)
[facility] [place], [place] [address]  Internal MEDICINE  Office Visit Note  Patient Name: Jill Wilson  [id number]  [id number]  Date of Service: 12/31/2017  Chief Complaint  Patient presents with  . Bronchitis    URI   This is a new problem. The current episode started in the past 7 days. The problem has been gradually worsening. There has been no fever. Associated symptoms include congestion, coughing, headaches, nausea, rhinorrhea, sinus pain, sneezing, a sore throat, swollen glands and wheezing. She has tried nothing for the symptoms.    Pt is here for routine follow up.    Current Medication: Outpatient Encounter Medications as of 12/11/2017  Medication Sig  . Calcium Carb-Cholecalciferol (CALTRATE 600+D3 SOFT PO) Take 1 tablet by mouth daily.  . cetirizine (ZYRTEC) 10 MG tablet Take 10 mg by mouth daily.  Marland Kitchen esomeprazole (NEXIUM) 40 MG capsule Take 40 mg by mouth daily at 12 noon.  Marland Kitchen levofloxacin (LEVAQUIN) 500 MG tablet Take 1 tablet (500 mg total) by mouth daily.  . montelukast (SINGULAIR) 10 MG tablet Take 10 mg by mouth at bedtime.   No facility-administered encounter medications on file as of 12/11/2017.     Surgical History: Past Surgical History:  Procedure Laterality Date  . ABDOMINAL HYSTERECTOMY    . APPENDECTOMY  1946  . Cataract Surgery    . ESOPHAGEAL DILATION    . TONSILLECTOMY    . TONSILLECTOMY Bilateral     Medical History: Past Medical History:  Diagnosis Date  . Allergy    Environmental  . GERD (gastroesophageal reflux disease)     Family History: Family History  Problem Relation Age of Onset  . Stroke Father     Social History   Socioeconomic History  . Marital status: Widowed    Spouse name: Not on file  . Number of children: Not on file  . Years of education: Not on file  . Highest education level: Not on file  Occupational History  . Not on file  Social Needs  . Financial resource strain: Not on  file  . Food insecurity:    Worry: Not on file    Inability: Not on file  . Transportation needs:    Medical: Not on file    Non-medical: Not on file  Tobacco Use  . Smoking status: Never Smoker  . Smokeless tobacco: Never Used  Substance and Sexual Activity  . Alcohol use: No  . Drug use: No  . Sexual activity: Not on file  Lifestyle  . Physical activity:    Days per week: Not on file    Minutes per session: Not on file  . Stress: Not on file  Relationships  . Social connections:    Talks on phone: Not on file    Gets together: Not on file    Attends religious service: Not on file    Active member of club or organization: Not on file    Attends meetings of clubs or organizations: Not on file    Relationship status: Not on file  . Intimate partner violence:    Fear of current or ex partner: Not on file    Emotionally abused: Not on file    Physically abused: Not on file    Forced sexual activity: Not on file  Other Topics Concern  . Not on file  Social History Narrative  . Not on file      Review of Systems  Constitutional: Positive for  fatigue. Negative for fever.  HENT: Positive for congestion, postnasal drip, rhinorrhea, sinus pain, sneezing and sore throat.   Eyes: Negative.   Respiratory: Positive for cough, shortness of breath and wheezing.   Gastrointestinal: Positive for nausea.  Endocrine: Negative for cold intolerance, heat intolerance, polydipsia, polyphagia and polyuria.  Genitourinary: Negative.   Musculoskeletal: Positive for arthralgias and myalgias.  Allergic/Immunologic: Positive for environmental allergies.  Neurological: Positive for headaches.  Hematological: Positive for adenopathy.  Psychiatric/Behavioral: Negative for dysphoric mood. The patient is not nervous/anxious.     Today's Vitals   12/11/17 1209  BP: 116/74  Pulse: 73  Resp: 16  Temp: 97.7 F (36.5 C)  SpO2: 100%  Weight: 137 lb (62.1 kg)  Height: 5\' 7"  (1.702 m)     Physical Exam  Constitutional: She is oriented to person, place, and time. She appears well-developed and well-nourished. She appears ill. No distress.  HENT:  Head: Normocephalic and atraumatic.  Right Ear: Tympanic membrane is bulging.  Left Ear: Tympanic membrane is bulging.  Nose: Rhinorrhea present. Right sinus exhibits maxillary sinus tenderness and frontal sinus tenderness. Left sinus exhibits maxillary sinus tenderness and frontal sinus tenderness.  Mouth/Throat: Oropharynx is clear and moist. No oropharyngeal exudate.  Post nasal drip is evident  Eyes: Pupils are equal, round, and reactive to light. EOM are normal.  Neck: Normal range of motion. Neck supple. No JVD present. No tracheal deviation present. No thyromegaly present.  Cardiovascular: Normal rate, regular rhythm and normal heart sounds. Exam reveals no gallop and no friction rub.  No murmur heard. Pulmonary/Chest: Effort normal. No respiratory distress. She has no wheezes. She has no rales. She exhibits no tenderness.  Congested breath sounds heard throughout the lung fields. Clear wiith cough. Cough is deep and productive.   Abdominal: Soft. Bowel sounds are normal. There is no tenderness.  Musculoskeletal: Normal range of motion.  Lymphadenopathy:    She has no cervical adenopathy.  Neurological: She is alert and oriented to person, place, and time. No cranial nerve deficit.  Skin: Skin is warm and dry. She is not diaphoretic.  Psychiatric: She has a normal mood and affect. Her behavior is normal. Judgment and thought content normal.  Nursing note and vitals reviewed.   Assessment/Plan: 1. Acute bronchitis, unspecified organism - levofloxacin (LEVAQUIN) 500 MG tablet; Take 1 tablet (500 mg total) by mouth daily. Recommend OTC mucinex to help treat congestion. Samples were provided today.   2. Non-seasonal allergic rhinitis, unspecified trigger Continue cetirizine and singulair as prescribed.   3. Essential  hypertension, benign Stable.  Continue bp medication as prescribed.    General Counseling: Jill Wilson understanding of the findings of todays visit and agrees with plan of treatment. I have discussed any further diagnostic evaluation that may be needed or ordered today. We also reviewed her medications today. she has been encouraged to call the office with any questions or concerns that should arise related to todays visit.   This patient was seen by Leretha Pol, FNP- C in Collaboration with Dr Lavera Guise as a part of collaborative care agreement     Meds ordered this encounter  Medications  . levofloxacin (LEVAQUIN) 500 MG tablet    Sig: Take 1 tablet (500 mg total) by mouth daily.    Dispense:  10 tablet    Refill:  0    Order Specific Question:   Supervising Provider    Answer:   Lavera Guise [[id number]]    Time spent: 15 Minutes  Dr Lavera Guise Internal medicine

## 2017-12-31 DIAGNOSIS — J209 Acute bronchitis, unspecified: Secondary | ICD-10-CM | POA: Insufficient documentation

## 2017-12-31 DIAGNOSIS — J3089 Other allergic rhinitis: Secondary | ICD-10-CM | POA: Insufficient documentation

## 2018-03-05 ENCOUNTER — Encounter: Payer: Self-pay | Admitting: Emergency Medicine

## 2018-03-05 ENCOUNTER — Other Ambulatory Visit: Payer: Self-pay

## 2018-03-05 ENCOUNTER — Ambulatory Visit
Admission: EM | Admit: 2018-03-05 | Discharge: 2018-03-05 | Disposition: A | Discharge status: Home / Self Care | Payer: Medicare Other | Attending: Family Medicine | Admitting: Family Medicine

## 2018-03-05 DIAGNOSIS — A084 Viral intestinal infection, unspecified: Secondary | ICD-10-CM | POA: Diagnosis not present

## 2018-03-05 DIAGNOSIS — R600 Localized edema: Secondary | ICD-10-CM | POA: Diagnosis not present

## 2018-03-05 NOTE — ED Provider Notes (Signed)
[facility]    CSN: [id number] Arrival date & time: 03/05/18  0941     History   Chief Complaint Chief Complaint  Patient presents with  . Leg Pain  . Leg Swelling    HPI Jill Wilson is a 84 y.o. female.   84 yo female with a c/o 1 year of leg swelling. Denies any pain. States her PCP has informed that it's because she's on her feet all day. Patient states she does work almost every day and is on her feet. Denies any chest pains or shortness of breath.   Also c/o some slight diarrhea for the past week. Denies any vomiting, fevers, chills, or abdominal pain.   The history is provided by the patient.  Leg Pain    Past Medical History:  Diagnosis Date  . Allergy    Environmental  . GERD (gastroesophageal reflux disease)     Patient Active Problem List   Diagnosis Date Noted  . Acute bronchitis 12/31/2017  . Non-seasonal allergic rhinitis 12/31/2017  . Infective urethritis 09/13/2017  . Essential hypertension, benign 09/13/2017    Past Surgical History:  Procedure Laterality Date  . ABDOMINAL HYSTERECTOMY    . APPENDECTOMY  1946  . Cataract Surgery    . ESOPHAGEAL DILATION    . TONSILLECTOMY    . TONSILLECTOMY Bilateral     OB History   None      Home Medications    Prior to Admission medications   Medication Sig Start Date End Date Taking? Authorizing Provider  Calcium Carb-Cholecalciferol (CALTRATE 600+D3 SOFT PO) Take 1 tablet by mouth daily.   Yes [provider]  cetirizine (ZYRTEC) 10 MG tablet Take 10 mg by mouth daily.   Yes [provider]  esomeprazole (NEXIUM) 40 MG capsule Take 40 mg by mouth daily at 12 noon.   Yes [provider]  potassium chloride (K-DUR) 10 MEQ tablet Take 10 mEq by mouth daily.   Yes [provider]  levofloxacin (LEVAQUIN) 500 MG tablet Take 1 tablet (500 mg total) by mouth daily. 12/11/17   Ronnell Freshwater, NP  montelukast (SINGULAIR) 10 MG tablet Take 10 mg by mouth at  bedtime.    [provider]    Family History Family History  Problem Relation Age of Onset  . Stroke Father     Social History Social History   Tobacco Use  . Smoking status: Never Smoker  . Smokeless tobacco: Never Used  Substance Use Topics  . Alcohol use: No  . Drug use: No     Allergies   Influenza vaccine live and Codeine   Review of Systems Review of Systems   Physical Exam Triage Vital Signs ED Triage Vitals  Enc Vitals Group     BP 03/05/18 1020 100/65     Pulse Rate 03/05/18 1020 80     Resp 03/05/18 1020 14     Temp 03/05/18 1020 98.2 F (36.8 C)     Temp Source 03/05/18 1020 Oral     SpO2 03/05/18 1020 99 %     Weight 03/05/18 1017 129 lb (58.5 kg)     Height 03/05/18 1017 5\' 7"  (1.702 m)     Head Circumference --      Peak Flow --      Pain Score 03/05/18 1017 4     Pain Loc --      Pain Edu? --      Excl. in [place]? --  No data found.  Updated Vital Signs BP 100/65 (BP Location: Left Arm)   Pulse 80   Temp 98.2 F (36.8 C) (Oral)   Resp 14   Ht 5\' 7"  (1.702 m)   Wt 129 lb (58.5 kg)   SpO2 99%   BMI 20.20 kg/m   Visual Acuity Right Eye Distance:   Left Eye Distance:   Bilateral Distance:    Right Eye Near:   Left Eye Near:    Bilateral Near:     Physical Exam  Constitutional: She appears well-developed and well-nourished. No distress.  Abdominal: Soft. Bowel sounds are normal. She exhibits no distension and no mass. There is no tenderness. There is no rebound and no guarding.  Musculoskeletal: She exhibits edema (1+).  Skin: She is not diaphoretic.  Nursing note and vitals reviewed.    UC Treatments / Results  Labs (all labs ordered are listed, but only abnormal results are displayed) Labs Reviewed - No data to display  EKG None  Radiology No results found.  Procedures Procedures (including critical care time)  Medications Ordered in UC Medications - No data to display  Initial Impression /  Assessment and Plan / UC Course  I have reviewed the triage vital signs and the nursing notes.  Pertinent labs & imaging results that were available during my care of the patient were reviewed by me and considered in my medical decision making (see chart for details).     Final Clinical Impressions(s) / UC Diagnoses   Final diagnoses:  Viral gastroenteritis  Leg edema     Discharge Instructions     Pedialyte, Imodium Liquids, bland/mild diet Compression stockings    ED Prescriptions    None     1. diagnosis reviewed with patient 2. Recommend supportive treatment as above 3. Follow-up prn if symptoms worsen or don't improve   Controlled Substance Prescriptions [place] Controlled Substance Registry consulted? Not Applicable   Norval Gable, MD 03/05/18 1251

## 2018-03-05 NOTE — Discharge Instructions (Signed)
Pedialyte, Imodium Liquids, bland/mild diet Compression stockings

## 2018-03-05 NOTE — ED Triage Notes (Signed)
Patient c/o bilateral leg pain and swelling for over a year.

## 2018-03-12 ENCOUNTER — Other Ambulatory Visit: Payer: Self-pay

## 2018-03-12 ENCOUNTER — Encounter: Payer: Self-pay | Admitting: Emergency Medicine

## 2018-03-12 DIAGNOSIS — E873 Alkalosis: Secondary | ICD-10-CM | POA: Diagnosis not present

## 2018-03-12 DIAGNOSIS — N183 Chronic kidney disease, stage 3 (moderate): Secondary | ICD-10-CM | POA: Diagnosis present

## 2018-03-12 DIAGNOSIS — R197 Diarrhea, unspecified: Secondary | ICD-10-CM | POA: Diagnosis not present

## 2018-03-12 DIAGNOSIS — Z823 Family history of stroke: Secondary | ICD-10-CM

## 2018-03-12 DIAGNOSIS — E860 Dehydration: Secondary | ICD-10-CM | POA: Diagnosis not present

## 2018-03-12 DIAGNOSIS — Z887 Allergy status to serum and vaccine status: Secondary | ICD-10-CM

## 2018-03-12 DIAGNOSIS — N179 Acute kidney failure, unspecified: Secondary | ICD-10-CM | POA: Diagnosis present

## 2018-03-12 DIAGNOSIS — R531 Weakness: Secondary | ICD-10-CM | POA: Diagnosis not present

## 2018-03-12 DIAGNOSIS — C8590 Non-Hodgkin lymphoma, unspecified, unspecified site: Secondary | ICD-10-CM | POA: Diagnosis not present

## 2018-03-12 DIAGNOSIS — I129 Hypertensive chronic kidney disease with stage 1 through stage 4 chronic kidney disease, or unspecified chronic kidney disease: Secondary | ICD-10-CM | POA: Diagnosis not present

## 2018-03-12 DIAGNOSIS — B379 Candidiasis, unspecified: Secondary | ICD-10-CM | POA: Diagnosis present

## 2018-03-12 DIAGNOSIS — Z885 Allergy status to narcotic agent status: Secondary | ICD-10-CM

## 2018-03-12 DIAGNOSIS — N289 Disorder of kidney and ureter, unspecified: Secondary | ICD-10-CM | POA: Diagnosis not present

## 2018-03-12 DIAGNOSIS — R188 Other ascites: Secondary | ICD-10-CM | POA: Diagnosis not present

## 2018-03-12 DIAGNOSIS — Z90710 Acquired absence of both cervix and uterus: Secondary | ICD-10-CM

## 2018-03-12 DIAGNOSIS — K219 Gastro-esophageal reflux disease without esophagitis: Secondary | ICD-10-CM | POA: Diagnosis present

## 2018-03-12 DIAGNOSIS — Z6824 Body mass index (BMI) 24.0-24.9, adult: Secondary | ICD-10-CM

## 2018-03-12 DIAGNOSIS — E871 Hypo-osmolality and hyponatremia: Secondary | ICD-10-CM | POA: Diagnosis not present

## 2018-03-12 DIAGNOSIS — D649 Anemia, unspecified: Secondary | ICD-10-CM | POA: Diagnosis present

## 2018-03-12 DIAGNOSIS — R600 Localized edema: Secondary | ICD-10-CM | POA: Diagnosis not present

## 2018-03-12 DIAGNOSIS — E43 Unspecified severe protein-calorie malnutrition: Secondary | ICD-10-CM | POA: Diagnosis present

## 2018-03-12 LAB — CBC
HCT: 34.9 % — ABNORMAL LOW (ref 35.0–47.0)
HEMOGLOBIN: 11.9 g/dL — AB (ref 12.0–16.0)
MCH: 30.6 pg (ref 26.0–34.0)
MCHC: 34 g/dL (ref 32.0–36.0)
MCV: 90.1 fL (ref 80.0–100.0)
Platelets: 276 10*3/uL (ref 150–440)
RBC: 3.87 MIL/uL (ref 3.80–5.20)
RDW: 15.5 % — ABNORMAL HIGH (ref 11.5–14.5)
WBC: 3.9 10*3/uL (ref 3.6–11.0)

## 2018-03-12 LAB — COMPREHENSIVE METABOLIC PANEL
ALBUMIN: 2.3 g/dL — AB (ref 3.5–5.0)
ALK PHOS: 98 U/L (ref 38–126)
ALT: 32 U/L (ref 14–54)
AST: 64 U/L — ABNORMAL HIGH (ref 15–41)
Anion gap: 9 (ref 5–15)
BUN: 31 mg/dL — ABNORMAL HIGH (ref 6–20)
CALCIUM: 7.7 mg/dL — AB (ref 8.9–10.3)
CHLORIDE: 102 mmol/L (ref 101–111)
CO2: 20 mmol/L — AB (ref 22–32)
Creatinine, Ser: 1.8 mg/dL — ABNORMAL HIGH (ref 0.44–1.00)
GFR calc Af Amer: 28 mL/min — ABNORMAL LOW (ref >60)
GFR calc non Af Amer: 25 mL/min — ABNORMAL LOW (ref >60)
GLUCOSE: 106 mg/dL — AB (ref 65–99)
Potassium: 4.3 mmol/L (ref 3.5–5.1)
SODIUM: 131 mmol/L — AB (ref 135–145)
Total Bilirubin: 0.7 mg/dL (ref 0.3–1.2)
Total Protein: 4.4 g/dL — ABNORMAL LOW (ref 6.5–8.1)

## 2018-03-12 LAB — LIPASE, BLOOD: LIPASE: 26 U/L (ref 11–51)

## 2018-03-12 NOTE — ED Triage Notes (Signed)
Pt presents to ED with diarrhea every time she eats for the past 4 weeks. Yellow in color. Family reports pt has lost a "bunch of weight". No recent antibiotic use. Denies abd pain but does report having some abd bloating.

## 2018-03-13 ENCOUNTER — Emergency Department: Payer: Medicare Other

## 2018-03-13 ENCOUNTER — Other Ambulatory Visit: Payer: Self-pay

## 2018-03-13 ENCOUNTER — Encounter: Payer: Self-pay | Admitting: Internal Medicine

## 2018-03-13 ENCOUNTER — Inpatient Hospital Stay
Admission: EM | Admit: 2018-03-13 | Discharge: 2018-03-17 | DRG: 840 | Disposition: A | Discharge status: Home Health | Payer: Medicare Other | Attending: Specialist | Admitting: Specialist

## 2018-03-13 DIAGNOSIS — R600 Localized edema: Secondary | ICD-10-CM | POA: Diagnosis not present

## 2018-03-13 DIAGNOSIS — R634 Abnormal weight loss: Secondary | ICD-10-CM

## 2018-03-13 DIAGNOSIS — R197 Diarrhea, unspecified: Secondary | ICD-10-CM | POA: Diagnosis present

## 2018-03-13 DIAGNOSIS — R531 Weakness: Secondary | ICD-10-CM

## 2018-03-13 DIAGNOSIS — R1907 Generalized intra-abdominal and pelvic swelling, mass and lump: Secondary | ICD-10-CM

## 2018-03-13 DIAGNOSIS — R599 Enlarged lymph nodes, unspecified: Secondary | ICD-10-CM

## 2018-03-13 DIAGNOSIS — C84Z9 Other mature T/NK-cell lymphomas, extranodal and solid organ sites: Secondary | ICD-10-CM | POA: Diagnosis not present

## 2018-03-13 DIAGNOSIS — K529 Noninfective gastroenteritis and colitis, unspecified: Secondary | ICD-10-CM

## 2018-03-13 DIAGNOSIS — N289 Disorder of kidney and ureter, unspecified: Secondary | ICD-10-CM

## 2018-03-13 DIAGNOSIS — E871 Hypo-osmolality and hyponatremia: Secondary | ICD-10-CM | POA: Diagnosis present

## 2018-03-13 DIAGNOSIS — Z90710 Acquired absence of both cervix and uterus: Secondary | ICD-10-CM | POA: Diagnosis not present

## 2018-03-13 DIAGNOSIS — E43 Unspecified severe protein-calorie malnutrition: Secondary | ICD-10-CM | POA: Diagnosis present

## 2018-03-13 DIAGNOSIS — E860 Dehydration: Secondary | ICD-10-CM | POA: Diagnosis present

## 2018-03-13 DIAGNOSIS — C762 Malignant neoplasm of abdomen: Secondary | ICD-10-CM | POA: Diagnosis not present

## 2018-03-13 DIAGNOSIS — K6389 Other specified diseases of intestine: Secondary | ICD-10-CM

## 2018-03-13 DIAGNOSIS — N183 Chronic kidney disease, stage 3 (moderate): Secondary | ICD-10-CM | POA: Diagnosis present

## 2018-03-13 DIAGNOSIS — Z823 Family history of stroke: Secondary | ICD-10-CM | POA: Diagnosis not present

## 2018-03-13 DIAGNOSIS — K639 Disease of intestine, unspecified: Secondary | ICD-10-CM

## 2018-03-13 DIAGNOSIS — R590 Localized enlarged lymph nodes: Secondary | ICD-10-CM

## 2018-03-13 DIAGNOSIS — R1909 Other intra-abdominal and pelvic swelling, mass and lump: Secondary | ICD-10-CM

## 2018-03-13 DIAGNOSIS — C8590 Non-Hodgkin lymphoma, unspecified, unspecified site: Secondary | ICD-10-CM | POA: Diagnosis not present

## 2018-03-13 DIAGNOSIS — Z6824 Body mass index (BMI) 24.0-24.9, adult: Secondary | ICD-10-CM | POA: Diagnosis not present

## 2018-03-13 DIAGNOSIS — R1900 Intra-abdominal and pelvic swelling, mass and lump, unspecified site: Secondary | ICD-10-CM

## 2018-03-13 DIAGNOSIS — N179 Acute kidney failure, unspecified: Secondary | ICD-10-CM | POA: Diagnosis present

## 2018-03-13 DIAGNOSIS — E873 Alkalosis: Secondary | ICD-10-CM | POA: Diagnosis present

## 2018-03-13 DIAGNOSIS — C269 Malignant neoplasm of ill-defined sites within the digestive system: Secondary | ICD-10-CM

## 2018-03-13 DIAGNOSIS — C8519 Unspecified B-cell lymphoma, extranodal and solid organ sites: Secondary | ICD-10-CM | POA: Diagnosis not present

## 2018-03-13 DIAGNOSIS — I129 Hypertensive chronic kidney disease with stage 1 through stage 4 chronic kidney disease, or unspecified chronic kidney disease: Secondary | ICD-10-CM | POA: Diagnosis present

## 2018-03-13 DIAGNOSIS — Z885 Allergy status to narcotic agent status: Secondary | ICD-10-CM | POA: Diagnosis not present

## 2018-03-13 DIAGNOSIS — K219 Gastro-esophageal reflux disease without esophagitis: Secondary | ICD-10-CM | POA: Diagnosis present

## 2018-03-13 DIAGNOSIS — Z887 Allergy status to serum and vaccine status: Secondary | ICD-10-CM | POA: Diagnosis not present

## 2018-03-13 DIAGNOSIS — D649 Anemia, unspecified: Secondary | ICD-10-CM | POA: Diagnosis present

## 2018-03-13 DIAGNOSIS — B379 Candidiasis, unspecified: Secondary | ICD-10-CM | POA: Diagnosis present

## 2018-03-13 DIAGNOSIS — N189 Chronic kidney disease, unspecified: Secondary | ICD-10-CM | POA: Diagnosis not present

## 2018-03-13 DIAGNOSIS — R188 Other ascites: Secondary | ICD-10-CM | POA: Diagnosis not present

## 2018-03-13 LAB — C DIFFICILE QUICK SCREEN W PCR REFLEX
C DIFFICILE (CDIFF) TOXIN: NEGATIVE
C Diff antigen: NEGATIVE
C Diff interpretation: NOT DETECTED

## 2018-03-13 LAB — URINALYSIS, COMPLETE (UACMP) WITH MICROSCOPIC
BILIRUBIN URINE: NEGATIVE
Bacteria, UA: NONE SEEN
GLUCOSE, UA: NEGATIVE mg/dL
Hgb urine dipstick: NEGATIVE
KETONES UR: NEGATIVE mg/dL
LEUKOCYTES UA: NEGATIVE
NITRITE: NEGATIVE
PH: 5 (ref 5.0–8.0)
Protein, ur: NEGATIVE mg/dL
Specific Gravity, Urine: 1.021 (ref 1.005–1.030)

## 2018-03-13 LAB — TROPONIN I: Troponin I: <0.03 ng/mL (ref <0.03)

## 2018-03-13 LAB — GASTROINTESTINAL PANEL BY PCR, STOOL (REPLACES STOOL CULTURE)

## 2018-03-13 MED ORDER — SENNOSIDES-DOCUSATE SODIUM 8.6-50 MG PO TABS: 1.0000 | ORAL_TABLET | Freq: Every evening | ORAL | Status: DC | PRN

## 2018-03-13 MED ORDER — ONDANSETRON HCL 4 MG/2ML IJ SOLN
4.0000 mg | Freq: Four times a day (QID) | INTRAMUSCULAR | Status: DC | PRN
  Filled 2018-03-13: qty 2

## 2018-03-13 MED ORDER — LOPERAMIDE HCL 1 MG/7.5ML PO SUSP
2.0000 mg | Freq: Four times a day (QID) | ORAL | Status: DC | PRN
  Filled 2018-03-13: qty 15

## 2018-03-13 MED ORDER — OXYCODONE HCL 5 MG PO TABS: 10.0000 mg | ORAL_TABLET | ORAL | Status: DC | PRN

## 2018-03-13 MED ORDER — DIPHENOXYLATE-ATROPINE 2.5-0.025 MG PO TABS
1.0000 | ORAL_TABLET | Freq: Four times a day (QID) | ORAL | Status: DC | PRN
  Administered 2018-03-14: 1 via ORAL
  Filled 2018-03-13: qty 1

## 2018-03-13 MED ORDER — ONDANSETRON HCL 4 MG PO TABS: 4.0000 mg | ORAL_TABLET | Freq: Four times a day (QID) | ORAL | Status: DC | PRN

## 2018-03-13 MED ORDER — IOPAMIDOL (ISOVUE-300) INJECTION 61%
15.0000 mL | INTRAVENOUS | Status: AC
  Administered 2018-03-13 (×2): 15 mL via ORAL

## 2018-03-13 MED ORDER — MORPHINE SULFATE (PF) 2 MG/ML IV SOLN: 2.0000 mg | INTRAVENOUS | Status: DC | PRN

## 2018-03-13 MED ORDER — OCTREOTIDE ACETATE 100 MCG/ML IJ SOLN
50.0000 ug | Freq: Three times a day (TID) | INTRAMUSCULAR | Status: DC
  Administered 2018-03-13: 20:00:00 50 ug via SUBCUTANEOUS
  Filled 2018-03-13 (×3): qty 0.5

## 2018-03-13 MED ORDER — OCTREOTIDE ACETATE 100 MCG/ML IJ SOLN
50.0000 ug | Freq: Three times a day (TID) | INTRAMUSCULAR | Status: DC
  Administered 2018-03-13 (×2): 50 ug via SUBCUTANEOUS
  Filled 2018-03-13: qty 0.5
  Filled 2018-03-13: qty 1
  Filled 2018-03-13: qty 0.5
  Filled 2018-03-13 (×4): qty 1

## 2018-03-13 MED ORDER — OXYCODONE HCL 5 MG PO TABS: 5.0000 mg | ORAL_TABLET | ORAL | Status: DC | PRN

## 2018-03-13 MED ORDER — SODIUM CHLORIDE 0.9 % IV BOLUS
1000.0000 mL | Freq: Once | INTRAVENOUS | Status: AC
  Administered 2018-03-13: 1000 mL via INTRAVENOUS

## 2018-03-13 MED ORDER — ADULT MULTIVITAMIN W/MINERALS CH
1.0000 | ORAL_TABLET | Freq: Every day | ORAL | Status: DC
  Administered 2018-03-14 – 2018-03-17 (×4): 1 via ORAL
  Filled 2018-03-13 (×4): qty 1

## 2018-03-13 MED ORDER — BISACODYL 5 MG PO TBEC: 5.0000 mg | DELAYED_RELEASE_TABLET | Freq: Every day | ORAL | Status: DC | PRN

## 2018-03-13 MED ORDER — ENOXAPARIN SODIUM 30 MG/0.3ML ~~LOC~~ SOLN
30.0000 mg | SUBCUTANEOUS | Status: DC
  Administered 2018-03-13: 21:00:00 30 mg via SUBCUTANEOUS
  Filled 2018-03-13: qty 0.3

## 2018-03-13 MED ORDER — SODIUM CHLORIDE 0.9 % IV SOLN
INTRAVENOUS | Status: DC
  Administered 2018-03-13 – 2018-03-14 (×3): via INTRAVENOUS

## 2018-03-13 MED ORDER — ACETAMINOPHEN 650 MG RE SUPP: 650.0000 mg | Freq: Four times a day (QID) | RECTAL | Status: DC | PRN

## 2018-03-13 MED ORDER — ENSURE ENLIVE PO LIQD
237.0000 mL | Freq: Three times a day (TID) | ORAL | Status: DC
  Administered 2018-03-14 – 2018-03-17 (×9): 237 mL via ORAL

## 2018-03-13 MED ORDER — VITAMIN B-1 100 MG PO TABS
100.0000 mg | ORAL_TABLET | Freq: Every day | ORAL | Status: DC
  Administered 2018-03-14 – 2018-03-17 (×4): 100 mg via ORAL
  Filled 2018-03-13 (×4): qty 1

## 2018-03-13 MED ORDER — MORPHINE SULFATE (PF) 4 MG/ML IV SOLN: 4.0000 mg | INTRAVENOUS | Status: DC | PRN

## 2018-03-13 MED ORDER — ACETAMINOPHEN 325 MG PO TABS: 650.0000 mg | ORAL_TABLET | Freq: Four times a day (QID) | ORAL | Status: DC | PRN

## 2018-03-13 MED ORDER — PANTOPRAZOLE SODIUM 40 MG PO TBEC
40.0000 mg | DELAYED_RELEASE_TABLET | Freq: Every day | ORAL | Status: DC
  Administered 2018-03-13 – 2018-03-17 (×5): 40 mg via ORAL
  Filled 2018-03-13 (×5): qty 1

## 2018-03-13 NOTE — Progress Notes (Signed)
Initial Nutrition Assessment  DOCUMENTATION CODES:   Severe malnutrition in context of chronic illness  INTERVENTION:  Agree with regular diet.  Provide Ensure Enlive po TID, each supplement provides 350 kcal and 20 grams of protein. Patient prefers chocolate.  Provide Magic cup TID with meals, each supplement provides 290 kcal and 9 grams of protein.  Provide daily MVI.  Provide thiamine 100 mg PO daily. Patient is at risk for thiamine deficiency, which can also contribute to lower extremity edema.  Consider scheduling octreotide injections between meals to decrease GI effects. Octreotide can alter absorption of dietary fats, which can also lead to diarrhea.  Monitor magnesium, potassium, and phosphorus daily for at least 3 days, MD to replete as needed, as pt is at risk for refeeding syndrome given severe malnutrition.  NUTRITION DIAGNOSIS:   Severe Malnutrition related to chronic illness(inadequate oral intake related to anorexia, early satiety, and diarrhea complicated by increased nutrient needs in settig of possible intra-abdominal malignancy) as evidenced by severe fat depletion, severe muscle depletion, severe edema.  GOAL:   Patient will meet greater than or equal to 90% of their needs  MONITOR:   PO intake, Supplement acceptance, Labs, Weight trends, I & O's  REASON FOR ASSESSMENT:   Malnutrition Screening Tool    ASSESSMENT:   84 year old female with PMHx of GERD who presented with 4 week history of diarrhea, weight loss, decreased appetite and PO intake, and leg edema with CT Abdomen/Pelvis findings suspicious for intra-abdominal malignancy (possible carcinoid), hyponatremia, hypoalbuminemia, renal failure (suspected acute on chronic).   Met with patient and her daughter at bedside. Patient reports she has had a poor appetite for 3 months now. She has been experiencing anorexia and occasional early satiety. She reports diarrhea for the past 4-6 weeks. The  diarrhea occurs 2-3 times per day, and occurs both during the day and overnight. It is watery in nature and yellow. She has been attempting to eat 3 meals per day. For breakfast she has cheerios in almond milk with cinnamon, ginger, and honey. For lunch she has a sandwich. For dinner she has soup and a baked potato. She has nutrition shakes at home, but cannot remember which brand she has. She is amenable to drinking Ensure here and would prefer the chocolate flavor. At home she takes calcium with D and potassium supplements. She reports she was very independent previously and still worked 2 jobs.   UBW was 152 lbs. She reports losing 20-25 lbs over the past 4-6 weeks, which would certainly be significant for time frame. Limited weight history in chart so unable to verify this weight loss. Current weight falsely elevated with edema. Will use weight of 58.5 kg on 03/05/2018 to estimate needs as this is likely much closer to dry weight than her current weights.  Medications reviewed and include: enoxaparin, octreotide 50 micrograms Q8hrs, pantoprazole, NS @ 75 mL/hr.  Labs reviewed: Sodium 131, CO2 20, BUN 31, Creatinine 1.8, Calcium 7.7 (corrects to 9.06 with albumin of 2.3). C. Diff negative. Pending ionized calcium (this is a send-out lab).  Discussed with RN.  NUTRITION - FOCUSED PHYSICAL EXAM:    Most Recent Value  Orbital Region  Severe depletion  Upper Arm Region  Severe depletion  Thoracic and Lumbar Region  Severe depletion  Buccal Region  Severe depletion  Temple Region  Severe depletion  Clavicle Bone Region  Severe depletion  Clavicle and Acromion Bone Region  Severe depletion  Scapular Bone Region  Severe depletion  Dorsal Hand  Severe depletion  Patellar Region  Unable to assess  Anterior Thigh Region  Unable to assess  Posterior Calf Region  Unable to assess  Edema (RD Assessment)  Severe [bilateral lower extremities]  Hair  Reviewed  Eyes  Reviewed  Mouth  Reviewed  Skin   Reviewed  Nails  Reviewed     Diet Order:   Diet Order           Diet regular Room service appropriate? Yes; Fluid consistency: Thin  Diet effective now          EDUCATION NEEDS:   Education needs have been addressed  Skin:  Skin Assessment: Reviewed RN Assessment  Last BM:  03/13/2018 - small type 7  Height:   Ht Readings from Last 1 Encounters:  03/13/18 _0  (1.676 m)    Weight:   Wt Readings from Last 1 Encounters:  03/13/18 150 lb 5.7 oz (68.2 kg)    Ideal Body Weight:  59.1 kg  BMI:  Body mass index is 24.27 kg/m.  Estimated Nutritional Needs:   Kcal:  4210-3128 (25-30 kcal/kg)  Protein:  80-95 grams (1.4-1.6 grams/kg)  Fluid:  1.5 L/day (25 mL/kg)  Willey Blade, MS, RD, LDN Office: [phone] Pager: [phone] After Hours/Weekend Pager: [phone]

## 2018-03-13 NOTE — ED Provider Notes (Signed)
[facility] Provider Note   ____________________________________________   First MD Initiated Contact with Patient 03/13/18 0036     (approximate)  I have reviewed the triage vital signs and the nursing notes.   HISTORY  Chief Complaint Diarrhea    HPI DEMI TRIEU is a 84 y.o. female who presents to the ED from home with a chief complaint of diarrhea.  Patient reports diarrhea for the past 4 weeks.  States every time she eats something it runs through her.  Saw her PCP approximately 1 week ago for diarrhea as well as bilateral lower extremity swelling.  Thought to have viral gastroenteritis and encouraged to wear compression stockings for her swelling.  Daughter states patient stands on her feet a lot as she works in a [employer].  Like swelling has been worse since the weather became hot.  Patient could not tolerate the compression stockings because they hurt her legs.  Denies recent fever, chills, chest pain, shortness of breath, nausea, vomiting, dysuria.  Occasional abdominal cramping with diarrhea.  Daughter states patient has lost approximately 20 pounds over the past month.  Denies recent travel, trauma or antibiotic use.  Patient last had a course of antibiotics in February for bronchitis.   Past Medical History:  Diagnosis Date  . Allergy    Environmental  . GERD (gastroesophageal reflux disease)     Patient Active Problem List   Diagnosis Date Noted  . Acute bronchitis 12/31/2017  . Non-seasonal allergic rhinitis 12/31/2017  . Infective urethritis 09/13/2017  . Essential hypertension, benign 09/13/2017    Past Surgical History:  Procedure Laterality Date  . ABDOMINAL HYSTERECTOMY    . APPENDECTOMY  1946  . Cataract Surgery    . ESOPHAGEAL DILATION    . TONSILLECTOMY    . TONSILLECTOMY Bilateral     Prior to Admission medications   Medication Sig Start Date End Date Taking? Authorizing Provider  Calcium  Carb-Cholecalciferol (CALTRATE 600+D3 SOFT PO) Take 1 tablet by mouth daily.    [provider]  cetirizine (ZYRTEC) 10 MG tablet Take 10 mg by mouth daily.    [provider]  esomeprazole (NEXIUM) 40 MG capsule Take 40 mg by mouth daily at 12 noon.    [provider]  levofloxacin (LEVAQUIN) 500 MG tablet Take 1 tablet (500 mg total) by mouth daily. 12/11/17   Ronnell Freshwater, NP  montelukast (SINGULAIR) 10 MG tablet Take 10 mg by mouth at bedtime.    [provider]  potassium chloride (K-DUR) 10 MEQ tablet Take 10 mEq by mouth daily.    [provider]    Allergies Influenza vaccine live and Codeine  Family History  Problem Relation Age of Onset  . Stroke Father     Social History Social History   Tobacco Use  . Smoking status: Never Smoker  . Smokeless tobacco: Never Used  Substance Use Topics  . Alcohol use: No  . Drug use: No    Review of Systems  Constitutional: No fever/chills Eyes: No visual changes. ENT: No sore throat. Cardiovascular: Denies chest pain. Respiratory: Denies shortness of breath. Gastrointestinal: No abdominal pain.  No nausea, no vomiting.  Positive for diarrhea.  No constipation. Genitourinary: Negative for dysuria. Musculoskeletal: Positive for BLE swelling.  Negative for back pain. Skin: Negative for rash. Neurological: Negative for headaches, focal weakness or numbness.   ____________________________________________   PHYSICAL EXAM:  VITAL SIGNS: ED Triage Vitals  Enc Vitals Group  BP --      Pulse --      Resp --      Temp --      Temp src --      SpO2 --      Weight 03/12/18 2228 135 lb (61.2 kg)     Height 03/12/18 2228 5\' 7"  (1.702 m)     Head Circumference --      Peak Flow --      Pain Score 03/12/18 2227 0     Pain Loc --      Pain Edu? --      Excl. in [place]? --     Constitutional: Alert and oriented.  Frail appearing and in mild acute distress. Eyes: Conjunctivae are  normal. PERRL. EOMI. Head: Atraumatic. Nose: No congestion/rhinnorhea. Mouth/Throat: Mucous membranes are moist.  Oropharynx non-erythematous. Neck: No stridor.   Cardiovascular: Normal rate, regular rhythm. Grossly normal heart sounds.  Good peripheral circulation. Respiratory: Normal respiratory effort.  No retractions. Lungs CTAB. Gastrointestinal: Soft and nontender to light or deep palpation. No distention. No abdominal bruits. No CVA tenderness. Musculoskeletal: No lower extremity tenderness. 2+ BLE pitting edema.  No joint effusions. Neurologic:  Normal speech and language. No gross focal neurologic deficits are appreciated. No gait instability. Skin:  Skin is warm, dry and intact. No rash noted. Psychiatric: Mood and affect are normal. Speech and behavior are normal.  ____________________________________________   LABS (all labs ordered are listed, but only abnormal results are displayed)  Labs Reviewed  COMPREHENSIVE METABOLIC PANEL - Abnormal; Notable for the following components:      Result Value   Sodium 131 (*)    CO2 20 (*)    Glucose, Bld 106 (*)    BUN 31 (*)    Creatinine, Ser 1.80 (*)    Calcium 7.7 (*)    Total Protein 4.4 (*)    Albumin 2.3 (*)    AST 64 (*)    GFR calc non Af Amer 25 (*)    GFR calc Af Amer 28 (*)    All other components within normal limits  CBC - Abnormal; Notable for the following components:   Hemoglobin 11.9 (*)    HCT 34.9 (*)    RDW 15.5 (*)    All other components within normal limits  URINALYSIS, COMPLETE (UACMP) WITH MICROSCOPIC - Abnormal; Notable for the following components:   Color, Urine AMBER (*)    APPearance CLEAR (*)    All other components within normal limits  C DIFFICILE QUICK SCREEN W PCR REFLEX  GASTROINTESTINAL PANEL BY PCR, STOOL (REPLACES STOOL CULTURE)  LIPASE, BLOOD  TROPONIN I   ____________________________________________  EKG  ED ECG REPORT I, SUNG,JADE J, the attending physician, personally  viewed and interpreted this ECG.   Date: 03/13/2018  EKG Time: 0222  Rate: 90  Rhythm: normal EKG, normal sinus rhythm  Axis: LAD  Intervals:none  ST&T Change: Nonspecific  ____________________________________________  RADIOLOGY  ED MD interpretation:  Concerning for GI malignancy; no acute cardiopulmonary process  Official radiology report(s): Ct Abdomen Pelvis Wo Contrast  Result Date: 03/13/2018 CLINICAL DATA:  84 year old with diarrhea for 4 weeks.  Weight loss. EXAM: CT ABDOMEN AND PELVIS WITHOUT CONTRAST TECHNIQUE: Multidetector CT imaging of the abdomen and pelvis was performed following the standard protocol without IV contrast. COMPARISON:  None. FINDINGS: Lower chest: No pleural fluid or focal consolidation. No basilar pulmonary nodule. Hepatobiliary: Lack of IV contrast limits assessment for focal lesion. Allowing for this, no  focal hepatic lesion is visualized. Gallbladder is significantly distended without calcified gallstone. Common bile duct is normal in caliber measuring 7 mm. Pancreas: No ductal dilatation or inflammation. No obvious pancreatic mass on noncontrast exam. Spleen: Normal in size.  No evidence of focal abnormality. Adrenals/Urinary Tract: No adrenal nodules. Right adrenal calcification may be sequela of prior hemorrhage. Bilateral renal parenchymal thinning. Prominence of bilateral renal collecting systems, left greater than right. No obstructing stone. Urinary bladder is partially distended. No obvious renal mass on noncontrast exam. Stomach/Bowel: No gastric wall thickening. No bowel obstruction. Enteric contrast is seen throughout the entire colon. Presence of intra-abdominal ascites limits assessment for bowel wall thickening. Allowing for this, no gross bowel wall thickening is seen. Appendix not visualized, patient with reported history of appendectomy. Vascular/Lymphatic: Prominent mesenteric nodes measuring up to 10 mm short axis. Mesenteric mass in the small  bowel mesentery of the left abdomen measures 3.7 x 2.7 cm. Additional smaller mesenteric nodes are visualized. Small retroperitoneal nodes not enlarged by size criteria. Mild aortic atherosclerosis without aneurysm. Reproductive: Cystic structure in the left adnexa measures approximately 5 x 3.9 cm, questionable cystic structure in the right adnexa. Post hysterectomy. Other: Moderate volume abdominopelvic ascites. Diffuse mesenteric edema and small volume mesenteric ascites. There is fat stranding and soft tissue density in the anterior omentum primarily the lower abdomen and pelvis suspicious for omental caking. Mild body wall edema. No free air. Musculoskeletal: Scattered small sclerotic densities in the lumbar spine and pelvis are nonspecific but likely bone islands. No destructive lytic lesion. No acute osseous abnormality. IMPRESSION: 1. Findings suspicious for intra-abdominal malignancy with mesenteric mass in the small bowel mesentery, prominent mesenteric nodes, and soft tissue density in the anterior omentum concerning for omental caking. Moderate volume abdominopelvic ascites. Primary malignancy considerations include ovarian with cystic lesion in the left adnexa measuring 5 x 3.9 cm. Carcinoid is also considered given reported history of diarrhea and mesenteric mass. Occult GI malignancy is also considered. Ascites sampling may be the least invasive target for sampling. Pelvic ultrasound could be considered for evaluation of cystic pelvic mass, however may be technically challenging. 2. Markedly distended gallbladder without biliary dilatation or calcified gallstone. 3. Prominence of bilateral renal collecting systems without frank hydronephrosis or urolithiasis. 4.  Aortic Atherosclerosis (ICD10-I70.0). Electronically Signed   By: Jeb Levering M.D.   On: 03/13/2018 03:30   Dg Chest 2 View  Result Date: 03/13/2018 CLINICAL DATA:  Lower leg edema EXAM: CHEST - 2 VIEW COMPARISON:  11/11/2015  FINDINGS: The heart size and mediastinal contours are within normal limits. Mild aortic atherosclerosis at the arch. No aneurysm identified. Both lungs are clear. No pulmonary vascular redistribution or CHF. Osteopenic appearance of the dorsal spine. IMPRESSION: No active cardiopulmonary disease. Electronically Signed   By: Ashley Royalty M.D.   On: 03/13/2018 02:01    ____________________________________________   PROCEDURES  Procedure(s) performed: None  Procedures  Critical Care performed: Yes, see critical care note(s)   CRITICAL CARE Performed by: Paulette Blanch   Total critical care time: 30 minutes  Critical care time was exclusive of separately billable procedures and treating other patients.  Critical care was necessary to treat or prevent imminent or life-threatening deterioration.  Critical care was time spent personally by me on the following activities: development of treatment plan with patient and/or surrogate as well as nursing, discussions with consultants, evaluation of patient's response to treatment, examination of patient, obtaining history from patient or surrogate, ordering and performing treatments and interventions, ordering  and review of laboratory studies, ordering and review of radiographic studies, pulse oximetry and re-evaluation of patient's condition.  ____________________________________________   INITIAL IMPRESSION / ASSESSMENT AND PLAN / ED COURSE  As part of my medical decision making, I reviewed the following data within the [place] History obtained from family, Nursing notes reviewed and incorporated, Labs reviewed, EKG interpreted, Old chart reviewed, Radiograph reviewed and Notes from prior ED visits   84 year old female who presents with a one-month history of diarrhea with occasional abdominal cramping.  Also has pitting bilateral lower extremity edema.  Differential diagnosis includes but is not limited to viral  gastroenteritis, colitis, diverticulitis, peripheral edema, valvular insufficiency, CHF, etc.  Lab work demonstrates renal insufficiency.  This is in comparison to a creatinine of 1.3 in 2014.  Will initiate IV fluids, obtain CT abdomen/pelvis to evaluate intra-abdominal etiology and reassess.   Clinical Course as of Mar 13 410  Tue Mar 13, 2018  0410 Patient sleeping.  Updated her family member of CT results concerning for GI malignancy.  Spoke with hospitalist who will evaluate patient in the emergency department for admission.   [JS]    Clinical Course User Index [JS] Paulette Blanch, MD     ____________________________________________   FINAL CLINICAL IMPRESSION(S) / ED DIAGNOSES  Final diagnoses:  Diarrhea, unspecified type  Dehydration  GI malignancy ([place])  Weakness  Renal insufficiency     ED Discharge Orders    None       Note:  This document was prepared using Dragon voice recognition software and may include unintentional dictation errors.    Paulette Blanch, MD 03/13/18 [phone]

## 2018-03-13 NOTE — Consult Note (Signed)
[facility]  [phone] [phone]  ID: [facility] OB: 1931-01-31  MR#: [id number]  QPR#:[id number]  Patient Care Team: Ronnell Freshwater, NP as PCP - General (Family Medicine)  CHIEF COMPLAINT: Nonbloody watery diarrhea, mesenteric mass with lymphadenopathy.  INTERVAL HISTORY: Patient is an 84 year old female who was in her usual state of health who started having nonbloody watery diarrhea on a daily basis approximately 1 month ago.  She has lost about 20 to 25 pounds over that same timeframe.  She denies any pain.  She has no neurologic complaints.  She denies any recent fevers.  She has a fair appetite.  She has no chest pain, cough, or shortness of breath.  She denies any nausea or vomiting.  She has no urinary complaints.  Patient feels generally terrible, but offers no further specific complaints.  REVIEW OF SYSTEMS:   Review of Systems  Constitutional: Positive for malaise/fatigue and weight loss. Negative for fever.  Respiratory: Negative.  Negative for cough and shortness of breath.   Cardiovascular: Negative.  Negative for chest pain and leg swelling.  Gastrointestinal: Positive for diarrhea. Negative for abdominal pain, blood in stool and melena.  Genitourinary: Negative.  Negative for dysuria.  Musculoskeletal: Negative.   Skin: Negative.  Negative for rash.  Neurological: Positive for focal weakness and weakness. Negative for sensory change.  Psychiatric/Behavioral: Negative.  The patient is not nervous/anxious.     As per HPI. Otherwise, a complete review of systems is negative.  PAST MEDICAL HISTORY: Past Medical History:  Diagnosis Date  . Allergy    Environmental  . GERD (gastroesophageal reflux disease)     PAST SURGICAL HISTORY: Past Surgical History:  Procedure Laterality Date  . ABDOMINAL HYSTERECTOMY    . APPENDECTOMY  1946  . Cataract Surgery    . ESOPHAGEAL DILATION    . TONSILLECTOMY    . TONSILLECTOMY  Bilateral     FAMILY HISTORY: Family History  Problem Relation Age of Onset  . Stroke Father     ADVANCED DIRECTIVES (Y/N):  @ADVDIR @  HEALTH MAINTENANCE: Social History   Tobacco Use  . Smoking status: Never Smoker  . Smokeless tobacco: Never Used  Substance Use Topics  . Alcohol use: No  . Drug use: No     Colonoscopy:  PAP:  Bone density:  Lipid panel:  Allergies  Allergen Reactions  . Influenza Vaccine Live Other (See Comments)    Was told to never take again because of severe illness  . Codeine Rash    Current Facility-Administered Medications  Medication Dose Route Frequency Provider Last Rate Last Dose  . 0.9 %  sodium chloride infusion   Intravenous Continuous Arta Silence, MD 75 mL/hr at 03/13/18 1912    . acetaminophen (TYLENOL) tablet 650 mg  650 mg Oral Q6H PRN Arta Silence, MD       Or  . acetaminophen (TYLENOL) suppository 650 mg  650 mg Rectal Q6H PRN Arta Silence, MD      . bisacodyl (DULCOLAX) EC tablet 5 mg  5 mg Oral Daily PRN Arta Silence, MD      . diphenoxylate-atropine (LOMOTIL) 2.5-0.025 MG per tablet 1 tablet  1 tablet Oral QID PRN Vanga, Tally Due, MD      . enoxaparin (LOVENOX) injection 30 mg  30 mg Subcutaneous Q24H Sridharan, Prasanna, MD      . feeding supplement (ENSURE ENLIVE) (ENSURE ENLIVE) liquid 237 mL  237 mL Oral TID BM Fritzi Mandes, MD      .  oxyCODONE (Oxy IR/ROXICODONE) immediate release tablet 5 mg  5 mg Oral Q4H PRN Arta Silence, MD       Or  . morphine 2 MG/ML injection 2 mg  2 mg Intravenous Q3H PRN Arta Silence, MD      . oxyCODONE (Oxy IR/ROXICODONE) immediate release tablet 10 mg  10 mg Oral Q4H PRN Arta Silence, MD       Or  . morphine 4 MG/ML injection 4 mg  4 mg Intravenous Q3H PRN Arta Silence, MD      . Derrill Memo ON 03/14/2018] multivitamin with minerals tablet 1 tablet  1 tablet Oral Daily Fritzi Mandes, MD      . octreotide (SANDOSTATIN) injection 50 mcg  50  mcg Subcutaneous TID BM Fritzi Mandes, MD   50 mcg at 03/13/18 2007  . ondansetron (ZOFRAN) tablet 4 mg  4 mg Oral Q6H PRN Arta Silence, MD       Or  . ondansetron (ZOFRAN) injection 4 mg  4 mg Intravenous Q6H PRN Arta Silence, MD      . pantoprazole (PROTONIX) EC tablet 40 mg  40 mg Oral Daily Arta Silence, MD   40 mg at 03/13/18 1044  . senna-docusate (Senokot-S) tablet 1 tablet  1 tablet Oral QHS PRN Arta Silence, MD      . Derrill Memo ON 03/14/2018] thiamine (VITAMIN B-1) tablet 100 mg  100 mg Oral Daily Fritzi Mandes, MD        OBJECTIVE: Vitals:   03/13/18 1303 03/13/18 2015  BP: (!) 98/59 111/72  Pulse: 91 96  Resp: 18 20  Temp: 98.5 F (36.9 C) 99.3 F (37.4 C)  SpO2: 100% 98%     Body mass index is 24.27 kg/m.    ECOG FS:1 - Symptomatic but completely ambulatory  General: Thin, no acute distress. Eyes: Pink conjunctiva, anicteric sclera. HEENT: Normocephalic, moist mucous membranes, clear oropharnyx. Lungs: Clear to auscultation bilaterally. Heart: Regular rate and rhythm. No rubs, murmurs, or gallops. Abdomen: Soft, nontender, nondistended. No organomegaly noted, normoactive bowel sounds. Musculoskeletal: No edema, cyanosis, or clubbing. Neuro: Alert, answering all questions appropriately. Cranial nerves grossly intact. Skin: No rashes or petechiae noted. Psych: Normal affect. Lymphatics: No cervical, calvicular, axillary or inguinal LAD.   LAB RESULTS:  Lab Results  Component Value Date   NA 131 (L) 03/12/2018   K 4.3 03/12/2018   CL 102 03/12/2018   CO2 20 (L) 03/12/2018   GLUCOSE 106 (H) 03/12/2018   BUN 31 (H) 03/12/2018   CREATININE 1.80 (H) 03/12/2018   CALCIUM 7.7 (L) 03/12/2018   PROT 4.4 (L) 03/12/2018   ALBUMIN 2.3 (L) 03/12/2018   AST 64 (H) 03/12/2018   ALT 32 03/12/2018   ALKPHOS 98 03/12/2018   BILITOT 0.7 03/12/2018   GFRNONAA 25 (L) 03/12/2018   GFRAA 28 (L) 03/12/2018    Lab Results  Component Value Date   WBC 3.9  03/12/2018   NEUTROABS 1.7 04/08/2012   HGB 11.9 (L) 03/12/2018   HCT 34.9 (L) 03/12/2018   MCV 90.1 03/12/2018   PLT 276 03/12/2018     STUDIES: Ct Abdomen Pelvis Wo Contrast  Result Date: 03/13/2018 CLINICAL DATA:  84 year old with diarrhea for 4 weeks.  Weight loss. EXAM: CT ABDOMEN AND PELVIS WITHOUT CONTRAST TECHNIQUE: Multidetector CT imaging of the abdomen and pelvis was performed following the standard protocol without IV contrast. COMPARISON:  None. FINDINGS: Lower chest: No pleural fluid or focal consolidation. No basilar pulmonary nodule. Hepatobiliary: Lack of IV contrast limits assessment  for focal lesion. Allowing for this, no focal hepatic lesion is visualized. Gallbladder is significantly distended without calcified gallstone. Common bile duct is normal in caliber measuring 7 mm. Pancreas: No ductal dilatation or inflammation. No obvious pancreatic mass on noncontrast exam. Spleen: Normal in size.  No evidence of focal abnormality. Adrenals/Urinary Tract: No adrenal nodules. Right adrenal calcification may be sequela of prior hemorrhage. Bilateral renal parenchymal thinning. Prominence of bilateral renal collecting systems, left greater than right. No obstructing stone. Urinary bladder is partially distended. No obvious renal mass on noncontrast exam. Stomach/Bowel: No gastric wall thickening. No bowel obstruction. Enteric contrast is seen throughout the entire colon. Presence of intra-abdominal ascites limits assessment for bowel wall thickening. Allowing for this, no gross bowel wall thickening is seen. Appendix not visualized, patient with reported history of appendectomy. Vascular/Lymphatic: Prominent mesenteric nodes measuring up to 10 mm short axis. Mesenteric mass in the small bowel mesentery of the left abdomen measures 3.7 x 2.7 cm. Additional smaller mesenteric nodes are visualized. Small retroperitoneal nodes not enlarged by size criteria. Mild aortic atherosclerosis without  aneurysm. Reproductive: Cystic structure in the left adnexa measures approximately 5 x 3.9 cm, questionable cystic structure in the right adnexa. Post hysterectomy. Other: Moderate volume abdominopelvic ascites. Diffuse mesenteric edema and small volume mesenteric ascites. There is fat stranding and soft tissue density in the anterior omentum primarily the lower abdomen and pelvis suspicious for omental caking. Mild body wall edema. No free air. Musculoskeletal: Scattered small sclerotic densities in the lumbar spine and pelvis are nonspecific but likely bone islands. No destructive lytic lesion. No acute osseous abnormality. IMPRESSION: 1. Findings suspicious for intra-abdominal malignancy with mesenteric mass in the small bowel mesentery, prominent mesenteric nodes, and soft tissue density in the anterior omentum concerning for omental caking. Moderate volume abdominopelvic ascites. Primary malignancy considerations include ovarian with cystic lesion in the left adnexa measuring 5 x 3.9 cm. Carcinoid is also considered given reported history of diarrhea and mesenteric mass. Occult GI malignancy is also considered. Ascites sampling may be the least invasive target for sampling. Pelvic ultrasound could be considered for evaluation of cystic pelvic mass, however may be technically challenging. 2. Markedly distended gallbladder without biliary dilatation or calcified gallstone. 3. Prominence of bilateral renal collecting systems without frank hydronephrosis or urolithiasis. 4.  Aortic Atherosclerosis (ICD10-I70.0). Electronically Signed   By: Jeb Levering M.D.   On: 03/13/2018 03:30   Dg Chest 2 View  Result Date: 03/13/2018 CLINICAL DATA:  Lower leg edema EXAM: CHEST - 2 VIEW COMPARISON:  11/11/2015 FINDINGS: The heart size and mediastinal contours are within normal limits. Mild aortic atherosclerosis at the arch. No aneurysm identified. Both lungs are clear. No pulmonary vascular redistribution or CHF.  Osteopenic appearance of the dorsal spine. IMPRESSION: No active cardiopulmonary disease. Electronically Signed   By: Ashley Royalty M.D.   On: 03/13/2018 02:01    ASSESSMENT: Nonbloody watery diarrhea, mesenteric mass with lymphadenopathy.  PLAN:   1.  Mesenteric mass with lymphadenopathy: Highly suspicious for underlying malignancy. Differential includes carcinoid, lymphoma, ovarian cancer, and possible pheochromocytoma.  Chromogranin A and 24-hour 5-HIAA have been ordered to assess for underlying carcinoid.  CEA and CA-125 are also pending.  Fractionated serum metanephrines have also been ordered for completeness.  Have ordered CT-guided biopsy of mesenteric mass to confirm the diagnosis. 2.  Diarrhea: Possibly related to underlying carcinoid.  Biopsy as above.  Appreciate GI input.  Appreciate consult, will follow.   Lloyd Huger, MD   03/13/2018  11:09 PM

## 2018-03-13 NOTE — ED Notes (Signed)
Pt went to X-Ray.  

## 2018-03-13 NOTE — Progress Notes (Signed)
[facility] [place] NAME: Jill Wilson    MR#:  [id number]  DATE OF BIRTH:  05-Sep-1931  SUBJECTIVE:   Patient came in with diarrhea for four weeks numerous to count episodes. So far has had one since this morning. Denies abdominal pain. Describes weight loss of 20 pounds over last four weeks. poor appetite.  Daughter in the room REVIEW OF SYSTEMS:   Review of Systems  Constitutional: Positive for malaise/fatigue and weight loss. Negative for chills and fever.  HENT: Negative for ear discharge, ear pain and nosebleeds.   Eyes: Negative for blurred vision, pain and discharge.  Respiratory: Negative for sputum production, shortness of breath, wheezing and stridor.   Cardiovascular: Negative for chest pain, palpitations, orthopnea and PND.  Gastrointestinal: Positive for diarrhea. Negative for abdominal pain, nausea and vomiting.  Genitourinary: Negative for frequency and urgency.  Musculoskeletal: Negative for back pain and joint pain.  Neurological: Negative for sensory change, speech change, focal weakness and weakness.  Psychiatric/Behavioral: Negative for depression and hallucinations. The patient is not nervous/anxious.    Tolerating Diet:regular Tolerating PT: pending  DRUG ALLERGIES:   Allergies  Allergen Reactions  . Influenza Vaccine Live Other (See Comments)    Was told to never take again because of severe illness  . Codeine Rash    VITALS:  Blood pressure (!) 98/59, pulse 91, temperature 98.5 F (36.9 C), temperature source Oral, resp. rate 18, height 5\' 6"  (1.676 m), weight 68.2 kg (150 lb 5.7 oz), SpO2 100 %.  PHYSICAL EXAMINATION:   Physical Exam  GENERAL:  84 y.o.-year-old patient lying in the bed with no acute distress. Thin cachectic EYES: Pupils equal, round, reactive to light and accommodation. No scleral icterus. Extraocular muscles intact.  HEENT: Head atraumatic, normocephalic. Oropharynx and nasopharynx  clear.  NECK:  Supple, no jugular venous distention. No thyroid enlargement, no tenderness.  LUNGS: Normal breath sounds bilaterally, no wheezing, rales, rhonchi. No use of accessory muscles of respiration.  CARDIOVASCULAR: S1, S2 normal. No murmurs, rubs, or gallops.  ABDOMEN: Soft, nontender, nondistended. Bowel sounds present. No organomegaly or mass.  EXTREMITIES: No cyanosis, clubbing or edema b/l.    NEUROLOGIC: Cranial nerves II through XII are intact. No focal Motor or sensory deficits b/l.   PSYCHIATRIC:  patient is alert and oriented x 3.  SKIN: No obvious rash, lesion, or ulcer.   LABORATORY PANEL:  CBC Recent Labs  Lab 03/12/18 2238  WBC 3.9  HGB 11.9*  HCT 34.9*  PLT 276    Chemistries  Recent Labs  Lab 03/12/18 2238  NA 131*  K 4.3  CL 102  CO2 20*  GLUCOSE 106*  BUN 31*  CREATININE 1.80*  CALCIUM 7.7*  AST 64*  ALT 32  ALKPHOS 98  BILITOT 0.7   Cardiac Enzymes Recent Labs  Lab 03/13/18 0041  TROPONINI <0.03   RADIOLOGY:  Ct Abdomen Pelvis Wo Contrast  Result Date: 03/13/2018 CLINICAL DATA:  84 year old with diarrhea for 4 weeks.  Weight loss. EXAM: CT ABDOMEN AND PELVIS WITHOUT CONTRAST TECHNIQUE: Multidetector CT imaging of the abdomen and pelvis was performed following the standard protocol without IV contrast. COMPARISON:  None. FINDINGS: Lower chest: No pleural fluid or focal consolidation. No basilar pulmonary nodule. Hepatobiliary: Lack of IV contrast limits assessment for focal lesion. Allowing for this, no focal hepatic lesion is visualized. Gallbladder is significantly distended without calcified gallstone. Common bile duct is normal in caliber measuring 7 mm. Pancreas: No ductal  dilatation or inflammation. No obvious pancreatic mass on noncontrast exam. Spleen: Normal in size.  No evidence of focal abnormality. Adrenals/Urinary Tract: No adrenal nodules. Right adrenal calcification may be sequela of prior hemorrhage. Bilateral renal parenchymal  thinning. Prominence of bilateral renal collecting systems, left greater than right. No obstructing stone. Urinary bladder is partially distended. No obvious renal mass on noncontrast exam. Stomach/Bowel: No gastric wall thickening. No bowel obstruction. Enteric contrast is seen throughout the entire colon. Presence of intra-abdominal ascites limits assessment for bowel wall thickening. Allowing for this, no gross bowel wall thickening is seen. Appendix not visualized, patient with reported history of appendectomy. Vascular/Lymphatic: Prominent mesenteric nodes measuring up to 10 mm short axis. Mesenteric mass in the small bowel mesentery of the left abdomen measures 3.7 x 2.7 cm. Additional smaller mesenteric nodes are visualized. Small retroperitoneal nodes not enlarged by size criteria. Mild aortic atherosclerosis without aneurysm. Reproductive: Cystic structure in the left adnexa measures approximately 5 x 3.9 cm, questionable cystic structure in the right adnexa. Post hysterectomy. Other: Moderate volume abdominopelvic ascites. Diffuse mesenteric edema and small volume mesenteric ascites. There is fat stranding and soft tissue density in the anterior omentum primarily the lower abdomen and pelvis suspicious for omental caking. Mild body wall edema. No free air. Musculoskeletal: Scattered small sclerotic densities in the lumbar spine and pelvis are nonspecific but likely bone islands. No destructive lytic lesion. No acute osseous abnormality. IMPRESSION: 1. Findings suspicious for intra-abdominal malignancy with mesenteric mass in the small bowel mesentery, prominent mesenteric nodes, and soft tissue density in the anterior omentum concerning for omental caking. Moderate volume abdominopelvic ascites. Primary malignancy considerations include ovarian with cystic lesion in the left adnexa measuring 5 x 3.9 cm. Carcinoid is also considered given reported history of diarrhea and mesenteric mass. Occult GI  malignancy is also considered. Ascites sampling may be the least invasive target for sampling. Pelvic ultrasound could be considered for evaluation of cystic pelvic mass, however may be technically challenging. 2. Markedly distended gallbladder without biliary dilatation or calcified gallstone. 3. Prominence of bilateral renal collecting systems without frank hydronephrosis or urolithiasis. 4.  Aortic Atherosclerosis (ICD10-I70.0). Electronically Signed   By: Jeb Levering M.D.   On: 03/13/2018 03:30   Dg Chest 2 View  Result Date: 03/13/2018 CLINICAL DATA:  Lower leg edema EXAM: CHEST - 2 VIEW COMPARISON:  11/11/2015 FINDINGS: The heart size and mediastinal contours are within normal limits. Mild aortic atherosclerosis at the arch. No aneurysm identified. Both lungs are clear. No pulmonary vascular redistribution or CHF. Osteopenic appearance of the dorsal spine. IMPRESSION: No active cardiopulmonary disease. Electronically Signed   By: Ashley Royalty M.D.   On: 03/13/2018 02:01   ASSESSMENT AND PLAN:   Jill Wilson  is a 84 y.o. female with a known history of GERD who p/w 4wk Hx diarrhea, weight loss, decreased appetite/decreased PO intake, leg edema  1. diarrhea with weight loss and abnormal CT of the abdomen -CT findings suggestive of interim abdominal malignancy with mesenteric mass in the small bowel mesentery -oncology consultation placed -continue IV fluids -stool studies negative. PRN Imodium -G.I. consultation placed. Not sure if this could be carcinoid  2. weight loss with hyponatremia hypo albumin anemia secondary to poor PO intake -IV fluids  3. Acute renal failure secondary dehydration from G.I. Losses  4. DVT prophylaxis subcu Lovenox Case discussed with Care Management/Social Worker. Management plans discussed with the patient, family and they are in agreement.  CODE STATUS: full  DVT Prophylaxis: lovenox  TOTAL TIME TAKING CARE OF THIS PATIENT: *30* minutes.  >50% time  spent on counselling and coordination of care  POSSIBLE D/C IN *1-2* DAYS, DEPENDING ON CLINICAL CONDITION.  Note: This dictation was prepared with Dragon dictation along with smaller phrase technology. Any transcriptional errors that result from this process are unintentional.  Fritzi Mandes M.D on 03/13/2018 at 2:47 PM  Between 7am to 6pm - Pager - [phone]  After 6pm go to [email or web address] - password [email or web address] [facility]  Office  [phone]  CC: Primary care physician; Ronnell Freshwater, NPPatient ID: Jill Wilson, female   DOB: [birth year], 84 y.o.   MRN: [id number]

## 2018-03-13 NOTE — Consult Note (Addendum)
Jill Darby, MD [address]  [place]  [place], [place] [address]  Main: [phone]  Fax: [phone] Pager: [phone]   Consultation  Referring Provider:     No ref. provider found Primary Care Physician:  Ronnell Freshwater, NP Primary Gastroenterologist:  Dr. Sherri Sear         Reason for Consultation:     Diarrhea, weight loss, mesenteric mass  Date of Admission:  03/13/2018 Date of Consultation:  03/13/2018         HPI:   Jill Wilson is a 84 y.o. female with no significant past medical history, presents with approximately 4 weeks' history of severe diarrhea leading to dehydration and 20 pound weight loss. She reports that she has been having bowel movements several times daily, nonbloody, associated with some bloating. She denies abdominal cramps, pain. She denies rectal bleeding, melena, hematochezia. She said she tried Imodium which did not help. she underwent CT A/P in the ER which revealed mesenteric lymphadenopathy and 3.7 x 2.7 cm mesenteric mass in the small bowel mesentery and moderate volume ascites. Patient is having lunch when I saw her. Her daughter is bedside. She was otherwise healthy and independent of ADLs prior to this illness C. Difficile and GI pathogen panel came back negative She presented with AKI, hyponatremia, mild anemia Oncology is also consulted  NSAIDs: none  Antiplts/Anticoagulants/Anti thrombotics: none  GI Procedures: reports having had colonoscopy several years ago by Dr. Vira Agar Upper endoscopy several years ago for dysphagia by Dr. Vira Agar  Past Medical History:  Diagnosis Date  . Allergy    Environmental  . GERD (gastroesophageal reflux disease)     Past Surgical History:  Procedure Laterality Date  . ABDOMINAL HYSTERECTOMY    . APPENDECTOMY  1946  . Cataract Surgery    . ESOPHAGEAL DILATION    . TONSILLECTOMY    . TONSILLECTOMY Bilateral     Prior to Admission medications   Medication Sig Start Date End  Date Taking? Authorizing Provider  Calcium Carb-Cholecalciferol (CALTRATE 600+D3 SOFT PO) Take 1 tablet by mouth daily.   Yes [provider]  cetirizine (ZYRTEC) 10 MG tablet Take 10 mg by mouth daily.   Yes [provider]  esomeprazole (NEXIUM) 40 MG capsule Take 40 mg by mouth daily at 12 noon.   Yes [provider]  potassium chloride (K-DUR) 10 MEQ tablet Take 10 mEq by mouth daily.   Yes [provider]  levofloxacin (LEVAQUIN) 500 MG tablet Take 1 tablet (500 mg total) by mouth daily. Patient not taking: Reported on 03/13/2018 12/11/17   Ronnell Freshwater, NP    Family History  Problem Relation Age of Onset  . Stroke Father      Social History   Tobacco Use  . Smoking status: Never Smoker  . Smokeless tobacco: Never Used  Substance Use Topics  . Alcohol use: No  . Drug use: No    Allergies as of 03/12/2018 - Review Complete 03/12/2018  Allergen Reaction Noted  . Influenza vaccine live Other (See Comments) 09/13/2017  . Codeine Rash 09/24/2015    Review of Systems:    All systems reviewed and negative except where noted in HPI.   Physical Exam:  Vital signs in last 24 hours: Temp:  [98.1 F (36.7 C)-98.7 F (37.1 C)] 98.5 F (36.9 C) (06/11 1303) Pulse Rate:  [81-92] 91 (06/11 1303) Resp:  [16-18] 18 (06/11 1303) BP: (89-114)/(54-73) 98/59 (06/11 1303) SpO2:  [80 %-  100 %] 100 % (06/11 1303) Weight:  [135 lb (61.2 kg)-150 lb 5.7 oz (68.2 kg)] 150 lb 5.7 oz (68.2 kg) (06/11 1432) Last BM Date: 03/13/18 General:   Pleasant, cooperative in NAD, thin built, cachectic Head:  Normocephalic, bitemporal wasting and atraumatic. Eyes:   No icterus.   Conjunctiva pink. PERRLA. Ears:  Normal auditory acuity. Neck:  Supple; no masses or thyroidomegaly Lungs: Respirations even and unlabored. Lungs clear to auscultation bilaterally.   No wheezes, crackles, or rhonchi.  Heart:  Regular rate and rhythm;  Without murmur, clicks, rubs or  gallops Abdomen:  Soft, nondistended, nontender. Normal bowel sounds. No appreciable masses or hepatomegaly.  No rebound or guarding.  Rectal:  Not performed. Msk:  Symmetrical without gross deformities.  Strength generalized weakness  Extremities:  Without edema, cyanosis or clubbing. Neurologic:  Alert and oriented x3;  grossly normal neurologically. Skin:  Intact without significant lesions or rashes. Psych:  Alert and cooperative. Normal affect.  LAB RESULTS: CBC Latest Ref Rng & Units 03/12/2018 04/08/2012  WBC 3.6 - 11.0 K/uL 3.9 3.5(L)  Hemoglobin 12.0 - 16.0 g/dL 11.9(L) 13.4  Hematocrit 35.0 - 47.0 % 34.9(L) 40.2  Platelets 150 - 440 K/uL 276 157    BMET BMP Latest Ref Rng & Units 03/12/2018  Glucose 65 - 99 mg/dL 106(H)  BUN 6 - 20 mg/dL 31(H)  Creatinine 0.44 - 1.00 mg/dL 1.80(H)  Sodium 135 - 145 mmol/L 131(L)  Potassium 3.5 - 5.1 mmol/L 4.3  Chloride 101 - 111 mmol/L 102  CO2 22 - 32 mmol/L 20(L)  Calcium 8.9 - 10.3 mg/dL 7.7(L)    LFT Hepatic Function Latest Ref Rng & Units 03/12/2018  Total Protein 6.5 - 8.1 g/dL 4.4(L)  Albumin 3.5 - 5.0 g/dL 2.3(L)  AST 15 - 41 U/L 64(H)  ALT 14 - 54 U/L 32  Alk Phosphatase 38 - 126 U/L 98  Total Bilirubin 0.3 - 1.2 mg/dL 0.7     STUDIES: Ct Abdomen Pelvis Wo Contrast  Result Date: 03/13/2018 CLINICAL DATA:  84 year old with diarrhea for 4 weeks.  Weight loss. EXAM: CT ABDOMEN AND PELVIS WITHOUT CONTRAST TECHNIQUE: Multidetector CT imaging of the abdomen and pelvis was performed following the standard protocol without IV contrast. COMPARISON:  None. FINDINGS: Lower chest: No pleural fluid or focal consolidation. No basilar pulmonary nodule. Hepatobiliary: Lack of IV contrast limits assessment for focal lesion. Allowing for this, no focal hepatic lesion is visualized. Gallbladder is significantly distended without calcified gallstone. Common bile duct is normal in caliber measuring 7 mm. Pancreas: No ductal dilatation or  inflammation. No obvious pancreatic mass on noncontrast exam. Spleen: Normal in size.  No evidence of focal abnormality. Adrenals/Urinary Tract: No adrenal nodules. Right adrenal calcification may be sequela of prior hemorrhage. Bilateral renal parenchymal thinning. Prominence of bilateral renal collecting systems, left greater than right. No obstructing stone. Urinary bladder is partially distended. No obvious renal mass on noncontrast exam. Stomach/Bowel: No gastric wall thickening. No bowel obstruction. Enteric contrast is seen throughout the entire colon. Presence of intra-abdominal ascites limits assessment for bowel wall thickening. Allowing for this, no gross bowel wall thickening is seen. Appendix not visualized, patient with reported history of appendectomy. Vascular/Lymphatic: Prominent mesenteric nodes measuring up to 10 mm short axis. Mesenteric mass in the small bowel mesentery of the left abdomen measures 3.7 x 2.7 cm. Additional smaller mesenteric nodes are visualized. Small retroperitoneal nodes not enlarged by size criteria. Mild aortic atherosclerosis without aneurysm. Reproductive: Cystic structure in the left  adnexa measures approximately 5 x 3.9 cm, questionable cystic structure in the right adnexa. Post hysterectomy. Other: Moderate volume abdominopelvic ascites. Diffuse mesenteric edema and small volume mesenteric ascites. There is fat stranding and soft tissue density in the anterior omentum primarily the lower abdomen and pelvis suspicious for omental caking. Mild body wall edema. No free air. Musculoskeletal: Scattered small sclerotic densities in the lumbar spine and pelvis are nonspecific but likely bone islands. No destructive lytic lesion. No acute osseous abnormality. IMPRESSION: 1. Findings suspicious for intra-abdominal malignancy with mesenteric mass in the small bowel mesentery, prominent mesenteric nodes, and soft tissue density in the anterior omentum concerning for omental  caking. Moderate volume abdominopelvic ascites. Primary malignancy considerations include ovarian with cystic lesion in the left adnexa measuring 5 x 3.9 cm. Carcinoid is also considered given reported history of diarrhea and mesenteric mass. Occult GI malignancy is also considered. Ascites sampling may be the least invasive target for sampling. Pelvic ultrasound could be considered for evaluation of cystic pelvic mass, however may be technically challenging. 2. Markedly distended gallbladder without biliary dilatation or calcified gallstone. 3. Prominence of bilateral renal collecting systems without frank hydronephrosis or urolithiasis. 4.  Aortic Atherosclerosis (ICD10-I70.0). Electronically Signed   By: Jeb Levering M.D.   On: 03/13/2018 03:30   Dg Chest 2 View  Result Date: 03/13/2018 CLINICAL DATA:  Lower leg edema EXAM: CHEST - 2 VIEW COMPARISON:  11/11/2015 FINDINGS: The heart size and mediastinal contours are within normal limits. Mild aortic atherosclerosis at the arch. No aneurysm identified. Both lungs are clear. No pulmonary vascular redistribution or CHF. Osteopenic appearance of the dorsal spine. IMPRESSION: No active cardiopulmonary disease. Electronically Signed   By: Ashley Royalty M.D.   On: 03/13/2018 02:01      Impression / Plan:   Jill Wilson is a 84 y.o. female with no significant past medical history, admitted with severe nonbloody diarrhea, unintentional weight loss, acute kidney injury from dehydration, CT revealed mesenteric mass and mesenteric lymphadenopathy. Differentials include carcinoid or lymphoma or carcinoma  - Trial of Lomotil - check serum chromogranin A levels, celiac serologies - She'll probably need surgical resection of the tumor and for histopathologic diagnosis - Oncology is consulted, we will defer to oncology for further workup   Thank you for involving me in the care of this patient.     LOS: 0 days   Sherri Sear, MD  03/13/2018, 5:50  PM   Note: This dictation was prepared with Dragon dictation along with smaller phrase technology. Any transcriptional errors that result from this process are unintentional.

## 2018-03-13 NOTE — ED Notes (Signed)
Mallie Mussel RN called lab to add the ordered troponin to the sample in the lab. Lab states that they will add the test to the sample.

## 2018-03-13 NOTE — H&P (Signed)
[facility] NAME: Jill Wilson    MR#:  [id number]  DATE OF BIRTH:  May 08, 1931  DATE OF ADMISSION:  03/13/2018  PRIMARY CARE PHYSICIAN: Ronnell Freshwater, NP   REQUESTING/REFERRING PHYSICIAN: Paulette Blanch, MD  CHIEF COMPLAINT:   Chief Complaint  Patient presents with  . Diarrhea    HISTORY OF PRESENT ILLNESS:  Jill Wilson  is a 84 y.o. female with a known history of GERD who p/w 4wk Hx diarrhea, weight loss, decreased appetite/decreased PO intake, leg edema. Pt is a remarkably healthy, functional, independent woman, working two jobs. She is stoic. She and her niece tell me that she has had diarrhea for four weeks, episodes xTNTC qD, initially liquid brown stool, eventually yellow liquid. Pt has not been eating, because PO intake triggers diarrhea. She has been drinking plenty of water, but was "unable to keep up". She has lost at least 20lbs in the past ~4wks. Her legs have gotten swollen w/ fluid. She denies abdominal pain, nausea/vomiting, F/C, night sweats, diaphoresis, rigors. She endorses generalized weakness, lightheadedness getting out of bed, and mild abdominal distension. She is otherwise comfortable and in no pain. She is w/o complaint. Labwork demonstrates hyponatremia and renal failure. CT imaging demonstrates, "Findings suspicious for intra-abdominal malignancy with mesenteric mass in the small bowel mesentery, prominent mesenteric nodes, and soft tissue density in the anterior omentum concerning for omental caking. Moderate volume abdominopelvic ascites. Primary malignancy considerations include ovarian with cystic lesion in the left adnexa measuring 5 x 3.9 cm. Carcinoid is also considered given reported history of diarrhea and mesenteric mass. Occult GI malignancy is also considered. Ascites sampling may be the least invasive target for sampling. Pelvic ultrasound could be considered for evaluation of cystic pelvic mass, however may  be technically challenging." Her clinical condition is consistent w/ carcinoid.  PAST MEDICAL HISTORY:   Past Medical History:  Diagnosis Date  . Allergy    Environmental  . GERD (gastroesophageal reflux disease)     PAST SURGICAL HISTORY:   Past Surgical History:  Procedure Laterality Date  . ABDOMINAL HYSTERECTOMY    . APPENDECTOMY  1946  . Cataract Surgery    . ESOPHAGEAL DILATION    . TONSILLECTOMY    . TONSILLECTOMY Bilateral     SOCIAL HISTORY:   Social History   Tobacco Use  . Smoking status: Never Smoker  . Smokeless tobacco: Never Used  Substance Use Topics  . Alcohol use: No    FAMILY HISTORY:   Family History  Problem Relation Age of Onset  . Stroke Father     DRUG ALLERGIES:   Allergies  Allergen Reactions  . Influenza Vaccine Live Other (See Comments)    Was told to never take again because of severe illness  . Codeine Rash    REVIEW OF SYSTEMS:   Review of Systems  Constitutional: Positive for malaise/fatigue and weight loss. Negative for chills, diaphoresis and fever.  HENT: Negative for congestion, ear pain, hearing loss, nosebleeds, sinus pain, sore throat and tinnitus.   Eyes: Negative for blurred vision, double vision, photophobia, pain, discharge and redness.  Respiratory: Negative for cough, hemoptysis, sputum production, shortness of breath and wheezing.   Cardiovascular: Positive for leg swelling. Negative for chest pain, palpitations, orthopnea, claudication and PND.  Gastrointestinal: Positive for diarrhea. Negative for abdominal pain, blood in stool, constipation, heartburn, melena, nausea and vomiting.  Genitourinary: Negative for dysuria, frequency, hematuria and urgency.  Musculoskeletal: Negative for  back pain, joint pain, myalgias and neck pain.  Skin: Negative for itching and rash.  Neurological: Positive for dizziness and weakness. Negative for tingling, tremors, sensory change, speech change, focal weakness, seizures,  loss of consciousness and headaches.  Psychiatric/Behavioral: Negative for memory loss. The patient does not have insomnia.     MEDICATIONS AT HOME:   Prior to Admission medications   Medication Sig Start Date End Date Taking? Authorizing Provider  Calcium Carb-Cholecalciferol (CALTRATE 600+D3 SOFT PO) Take 1 tablet by mouth daily.   Yes [provider]  cetirizine (ZYRTEC) 10 MG tablet Take 10 mg by mouth daily.   Yes [provider]  esomeprazole (NEXIUM) 40 MG capsule Take 40 mg by mouth daily at 12 noon.   Yes [provider]  potassium chloride (K-DUR) 10 MEQ tablet Take 10 mEq by mouth daily.   Yes [provider]  levofloxacin (LEVAQUIN) 500 MG tablet Take 1 tablet (500 mg total) by mouth daily. Patient not taking: Reported on 03/13/2018 12/11/17   Ronnell Freshwater, NP     VITAL SIGNS:  Blood pressure 110/67, pulse 86, temperature 98.7 F (37.1 C), temperature source Oral, resp. rate 16, height 5\' 7"  (1.702 m), weight 61.2 kg (135 lb), SpO2 98 %.  PHYSICAL EXAMINATION:  Physical Exam  Constitutional: She is oriented to person, place, and time. She appears well-developed. She is active and cooperative.  Non-toxic appearance. She does not have a sickly appearance. She does not appear ill. No distress. She is not intubated.  HENT:  Head: Normocephalic and atraumatic.  Mouth/Throat: Oropharynx is clear and moist. No oropharyngeal exudate.  Eyes: Conjunctivae, EOM and lids are normal. No scleral icterus.  Neck: Neck supple. No JVD present. No thyromegaly present.  Cardiovascular: Normal rate, regular rhythm, S1 normal, S2 normal and normal heart sounds.  No extrasystoles are present. Exam reveals no gallop, no S3, no S4, no distant heart sounds and no friction rub.  No murmur heard. Pulmonary/Chest: Effort normal and breath sounds normal. No accessory muscle usage or stridor. No apnea, no tachypnea and no bradypnea. She is not intubated. No  respiratory distress. She has no decreased breath sounds. She has no wheezes. She has no rhonchi. She has no rales.  Abdominal: Soft. She exhibits distension. Bowel sounds are decreased. There is no tenderness. There is no rebound and no guarding.  Musculoskeletal: Normal range of motion. She exhibits edema. She exhibits no tenderness.  Lymphadenopathy:    She has no cervical adenopathy.  Neurological: She is alert and oriented to person, place, and time. She is not disoriented.  Skin: Skin is warm, dry and intact. No rash noted. She is not diaphoretic. No erythema.  Psychiatric: She has a normal mood and affect. Her speech is normal and behavior is normal. Judgment and thought content normal. Cognition and memory are normal.   Thin. 2-3+ B/L LE edema. Mild abdominal distension, (-) TTP. LABORATORY PANEL:   CBC Recent Labs  Lab 03/12/18 2238  WBC 3.9  HGB 11.9*  HCT 34.9*  PLT 276   ------------------------------------------------------------------------------------------------------------------  Chemistries  Recent Labs  Lab 03/12/18 2238  NA 131*  K 4.3  CL 102  CO2 20*  GLUCOSE 106*  BUN 31*  CREATININE 1.80*  CALCIUM 7.7*  AST 64*  ALT 32  ALKPHOS 98  BILITOT 0.7   ------------------------------------------------------------------------------------------------------------------  Cardiac Enzymes Recent Labs  Lab 03/13/18 0041  TROPONINI <0.03   ------------------------------------------------------------------------------------------------------------------  RADIOLOGY:  Ct Abdomen Pelvis Wo Contrast  Result Date:  03/13/2018 CLINICAL DATA:  84 year old with diarrhea for 4 weeks.  Weight loss. EXAM: CT ABDOMEN AND PELVIS WITHOUT CONTRAST TECHNIQUE: Multidetector CT imaging of the abdomen and pelvis was performed following the standard protocol without IV contrast. COMPARISON:  None. FINDINGS: Lower chest: No pleural fluid or focal consolidation. No basilar  pulmonary nodule. Hepatobiliary: Lack of IV contrast limits assessment for focal lesion. Allowing for this, no focal hepatic lesion is visualized. Gallbladder is significantly distended without calcified gallstone. Common bile duct is normal in caliber measuring 7 mm. Pancreas: No ductal dilatation or inflammation. No obvious pancreatic mass on noncontrast exam. Spleen: Normal in size.  No evidence of focal abnormality. Adrenals/Urinary Tract: No adrenal nodules. Right adrenal calcification may be sequela of prior hemorrhage. Bilateral renal parenchymal thinning. Prominence of bilateral renal collecting systems, left greater than right. No obstructing stone. Urinary bladder is partially distended. No obvious renal mass on noncontrast exam. Stomach/Bowel: No gastric wall thickening. No bowel obstruction. Enteric contrast is seen throughout the entire colon. Presence of intra-abdominal ascites limits assessment for bowel wall thickening. Allowing for this, no gross bowel wall thickening is seen. Appendix not visualized, patient with reported history of appendectomy. Vascular/Lymphatic: Prominent mesenteric nodes measuring up to 10 mm short axis. Mesenteric mass in the small bowel mesentery of the left abdomen measures 3.7 x 2.7 cm. Additional smaller mesenteric nodes are visualized. Small retroperitoneal nodes not enlarged by size criteria. Mild aortic atherosclerosis without aneurysm. Reproductive: Cystic structure in the left adnexa measures approximately 5 x 3.9 cm, questionable cystic structure in the right adnexa. Post hysterectomy. Other: Moderate volume abdominopelvic ascites. Diffuse mesenteric edema and small volume mesenteric ascites. There is fat stranding and soft tissue density in the anterior omentum primarily the lower abdomen and pelvis suspicious for omental caking. Mild body wall edema. No free air. Musculoskeletal: Scattered small sclerotic densities in the lumbar spine and pelvis are nonspecific  but likely bone islands. No destructive lytic lesion. No acute osseous abnormality. IMPRESSION: 1. Findings suspicious for intra-abdominal malignancy with mesenteric mass in the small bowel mesentery, prominent mesenteric nodes, and soft tissue density in the anterior omentum concerning for omental caking. Moderate volume abdominopelvic ascites. Primary malignancy considerations include ovarian with cystic lesion in the left adnexa measuring 5 x 3.9 cm. Carcinoid is also considered given reported history of diarrhea and mesenteric mass. Occult GI malignancy is also considered. Ascites sampling may be the least invasive target for sampling. Pelvic ultrasound could be considered for evaluation of cystic pelvic mass, however may be technically challenging. 2. Markedly distended gallbladder without biliary dilatation or calcified gallstone. 3. Prominence of bilateral renal collecting systems without frank hydronephrosis or urolithiasis. 4.  Aortic Atherosclerosis (ICD10-I70.0). Electronically Signed   By: Jeb Levering M.D.   On: 03/13/2018 03:30   Dg Chest 2 View  Result Date: 03/13/2018 CLINICAL DATA:  Lower leg edema EXAM: CHEST - 2 VIEW COMPARISON:  11/11/2015 FINDINGS: The heart size and mediastinal contours are within normal limits. Mild aortic atherosclerosis at the arch. No aneurysm identified. Both lungs are clear. No pulmonary vascular redistribution or CHF. Osteopenic appearance of the dorsal spine. IMPRESSION: No active cardiopulmonary disease. Electronically Signed   By: Ashley Royalty M.D.   On: 03/13/2018 02:01   IMPRESSION AND PLAN:   A/P: 60F w/ 4wks diarrhea, CT A/P findings suspicious for intraabdominal malignancy, possible carcinoid. Hyponatremia, hypoalbuminemia, leg edema, renal failure (suspect acute on chronic), hypocalcemia, mild transaminasemia, mild normocytic anemia, mild hyperglycemia. -C. Difficile (-) -CT A/P demonstrates, "Findings suspicious  for intra-abdominal malignancy with  mesenteric mass in the small bowel mesentery, prominent mesenteric nodes, and soft tissue density in the anterior omentum concerning for omental caking. Moderate volume abdominopelvic ascites. Primary malignancy considerations include ovarian with cystic lesion in the left adnexa measuring 5 x 3.9 cm. Carcinoid is also considered given reported history of diarrhea and mesenteric mass. Occult GI malignancy is also considered. Ascites sampling may be the least invasive target for sampling. Pelvic ultrasound could be considered for evaluation of cystic pelvic mass, however may be technically challenging." -Suspect Carcinoid given clinical picture -Trial Octreotide 8mcg SQ TID -Oncology consult -Hyponatremia and hypoalbuminemia likely 2/2 poor PO intake of foods; pt reportedly drinking lots of water -IVF NS, monitor BMP -Leg edema due to hypoalbuminemia -Cr 1.80, last recorded data 1.3 in 2014. Pt likely w/ prerenal AKI superimposed on CKD III (2/2 aged kidney). IVF, monitor BMP, avoid nephrotoxins. Consider urine studies (electrolytes, protein, creatinine, urea nitrogen), renal U/S, nephrology consult if w/o improvement -Low calcium likely 2/2 low albumin, ionized calcium pending -Normocytic anemia possibly anemia of chronic disease. Very mild, no evidence of acute blood loss. -c/w home meds/formulary subs -Regular diet as tolerated -Lovenox -Full code -Admission, > 2 midnights   All the records are reviewed and case discussed with ED provider. Management plans discussed with the patient, family and they are in agreement.  CODE STATUS: Full code  TOTAL TIME TAKING CARE OF THIS PATIENT: 90 minutes.    Arta Silence M.D on 03/13/2018 at 4:28 AM  Between 7am to 6pm - Pager - [phone]  After 6pm go to [email or web address] - Proofreader  [facility]  Office  [phone]  CC: Primary care physician; Ronnell Freshwater, NP   Note: This dictation was  prepared with Dragon dictation along with smaller phrase technology. Any transcriptional errors that result from this process are unintentional.

## 2018-03-14 ENCOUNTER — Inpatient Hospital Stay: Payer: Medicare Other

## 2018-03-14 LAB — BASIC METABOLIC PANEL
ANION GAP: 7 (ref 5–15)
BUN: 23 mg/dL — ABNORMAL HIGH (ref 6–20)
CALCIUM: 7.3 mg/dL — AB (ref 8.9–10.3)
CHLORIDE: 109 mmol/L (ref 101–111)
CO2: 17 mmol/L — ABNORMAL LOW (ref 22–32)
CREATININE: 1.4 mg/dL — AB (ref 0.44–1.00)
GFR calc non Af Amer: 33 mL/min — ABNORMAL LOW (ref >60)
GFR, EST AFRICAN AMERICAN: 39 mL/min — AB (ref >60)
Glucose, Bld: 124 mg/dL — ABNORMAL HIGH (ref 65–99)
Potassium: 3.8 mmol/L (ref 3.5–5.1)
SODIUM: 133 mmol/L — AB (ref 135–145)

## 2018-03-14 LAB — BASIC METABOLIC PANEL WITH GFR
Anion gap: 9 (ref 5–15)
BUN: 23 mg/dL — ABNORMAL HIGH (ref 6–20)
CO2: 18 mmol/L — ABNORMAL LOW (ref 22–32)
Calcium: 7.3 mg/dL — ABNORMAL LOW (ref 8.9–10.3)
Chloride: 108 mmol/L (ref 101–111)
Creatinine, Ser: 1.45 mg/dL — ABNORMAL HIGH (ref 0.44–1.00)
GFR calc Af Amer: 37 mL/min — ABNORMAL LOW
GFR calc non Af Amer: 32 mL/min — ABNORMAL LOW
Glucose, Bld: 130 mg/dL — ABNORMAL HIGH (ref 65–99)
Potassium: 3.5 mmol/L (ref 3.5–5.1)
Sodium: 135 mmol/L (ref 135–145)

## 2018-03-14 LAB — BODY FLUID CELL COUNT WITH DIFFERENTIAL
EOS FL: 0 %
LYMPHS FL: 16 %
MONOCYTE-MACROPHAGE-SEROUS FLUID: 74 %
Neutrophil Count, Fluid: 10 %
OTHER CELLS FL: 0 %
Total Nucleated Cell Count, Fluid: 13251 cu mm

## 2018-03-14 LAB — CHROMOGRANIN A: Chromogranin A: 16 nmol/L — ABNORMAL HIGH (ref 0–5)

## 2018-03-14 LAB — PROTEIN, PLEURAL OR PERITONEAL FLUID: Total protein, fluid: <3 g/dL

## 2018-03-14 LAB — ALBUMIN, PLEURAL OR PERITONEAL FLUID

## 2018-03-14 LAB — LACTATE DEHYDROGENASE, PLEURAL OR PERITONEAL FLUID: LD FL: 1024 U/L — AB (ref 3–23)

## 2018-03-14 LAB — MAGNESIUM
MAGNESIUM: 1.8 mg/dL (ref 1.7–2.4)
Magnesium: 1.9 mg/dL (ref 1.7–2.4)

## 2018-03-14 LAB — PHOSPHORUS
Phosphorus: 3.7 mg/dL (ref 2.5–4.6)
Phosphorus: 3.8 mg/dL (ref 2.5–4.6)

## 2018-03-14 MED ORDER — SODIUM CHLORIDE 0.45 % IV SOLN
INTRAVENOUS | Status: DC
  Administered 2018-03-14 – 2018-03-16 (×5): via INTRAVENOUS
  Filled 2018-03-14 (×7): qty 100

## 2018-03-14 MED ORDER — OCTREOTIDE ACETATE 100 MCG/ML IJ SOLN
100.0000 ug | Freq: Three times a day (TID) | INTRAMUSCULAR | Status: DC
  Administered 2018-03-14 (×3): 100 ug via SUBCUTANEOUS
  Filled 2018-03-14 (×5): qty 1

## 2018-03-14 MED ORDER — DIPHENOXYLATE-ATROPINE 2.5-0.025 MG PO TABS
1.0000 | ORAL_TABLET | Freq: Four times a day (QID) | ORAL | Status: DC
  Administered 2018-03-14 – 2018-03-15 (×7): 1 via ORAL
  Filled 2018-03-14 (×7): qty 1

## 2018-03-14 MED ORDER — MENTHOL 3 MG MT LOZG
1.0000 | LOZENGE | OROMUCOSAL | Status: DC | PRN
  Administered 2018-03-15: 3 mg via ORAL
  Filled 2018-03-14 (×3): qty 9

## 2018-03-14 NOTE — Progress Notes (Signed)
MEDICATION RELATED CONSULT NOTE - INITIAL  Pharmacy Consult for Electrolyte Management Indication: severe diarrhea  Allergies  Allergen Reactions  . Influenza Vaccine Live Other (See Comments)    Was told to never take again because of severe illness  . Codeine Rash    Patient Measurements: Height: 5\' 6"  (167.6 cm) Weight: 150 lb 5.7 oz (68.2 kg)(bed scale) IBW/kg (Calculated) : 59.3 Adjusted Body Weight:    Vital Signs: Temp: 100.5 F (38.1 C) (06/12 0612) Temp Source: Oral (06/12 0612) BP: 93/63 (06/12 0612) Pulse Rate: 105 (06/12 0612) Intake/Output from previous day: 06/11 0701 - 06/12 0700 In: 3244 [I.V.:1775] Out: -  Intake/Output from this shift: No intake/output data recorded.  Labs: BMP Latest Ref Rng & Units 03/14/2018 03/12/2018  Glucose 65 - 99 mg/dL 124(H) 106(H)  BUN 6 - 20 mg/dL 23(H) 31(H)  Creatinine 0.44 - 1.00 mg/dL 1.40(H) 1.80(H)  Sodium 135 - 145 mmol/L 133(L) 131(L)  Potassium 3.5 - 5.1 mmol/L 3.8 4.3  Chloride 101 - 111 mmol/L 109 102  CO2 22 - 32 mmol/L 17(L) 20(L)  Calcium 8.9 - 10.3 mg/dL 7.3(L) 7.7(L)    Estimated Creatinine Clearance: 27.5 mL/min (A) (by C-G formula based on SCr of 1.4 mg/dL (H)).   Microbiology: Recent Results (from the past 720 hour(s))  C difficile quick scan w PCR reflex     Status: None   Collection Time: 03/13/18 12:41 AM  Result Value Ref Range Status   C Diff antigen NEGATIVE NEGATIVE Final   C Diff toxin NEGATIVE NEGATIVE Final   C Diff interpretation No C. difficile detected.  Final    Comment: Performed at [facility], [address] [place], [place] [address]  Gastrointestinal Panel by PCR , Stool     Status: None   Collection Time: 03/13/18 12:41 AM  Result Value Ref Range Status   Campylobacter species NOT DETECTED NOT DETECTED Final   Plesimonas shigelloides NOT DETECTED NOT DETECTED Final   Salmonella species NOT DETECTED NOT DETECTED Final   Yersinia enterocolitica NOT DETECTED NOT  DETECTED Final   Vibrio species NOT DETECTED NOT DETECTED Final   Vibrio cholerae NOT DETECTED NOT DETECTED Final   Enteroaggregative E coli (EAEC) NOT DETECTED NOT DETECTED Final   Enteropathogenic E coli (EPEC) NOT DETECTED NOT DETECTED Final   Enterotoxigenic E coli (ETEC) NOT DETECTED NOT DETECTED Final   Shiga like toxin producing E coli (STEC) NOT DETECTED NOT DETECTED Final   Shigella/Enteroinvasive E coli (EIEC) NOT DETECTED NOT DETECTED Final   Cryptosporidium NOT DETECTED NOT DETECTED Final   Cyclospora cayetanensis NOT DETECTED NOT DETECTED Final   Entamoeba histolytica NOT DETECTED NOT DETECTED Final   Giardia lamblia NOT DETECTED NOT DETECTED Final   Adenovirus F40/41 NOT DETECTED NOT DETECTED Final   Astrovirus NOT DETECTED NOT DETECTED Final   Norovirus GI/GII NOT DETECTED NOT DETECTED Final   Rotavirus A NOT DETECTED NOT DETECTED Final   Sapovirus (I, II, IV, and V) NOT DETECTED NOT DETECTED Final    Comment: Performed at [facility], [address] [place], [place] [address]    Medical History: Past Medical History:  Diagnosis Date  . Allergy    Environmental  . GERD (gastroesophageal reflux disease)     Assessment: Patient is a 84yo female admitted for profuse diarrhea possibly due to carcinoid tumor. Pharmacy consulted to manage electrolytes.  K=3.8, Phos-=3.7, Mg=1.8  Goal of Therapy:  Maintain electrolytes within normal limits  Plan:  Patient has been started on  Bicarb drip. No additional supplementation at this time. Will recheck electrolytes at 18:00.  Paulina Fusi, PharmD, BCPS 03/14/2018 10:28 AM

## 2018-03-14 NOTE — Progress Notes (Addendum)
MEDICATION RELATED CONSULT NOTE - INITIAL  Pharmacy Consult for Electrolyte Management Indication: severe diarrhea  Allergies  Allergen Reactions  . Influenza Vaccine Live Other (See Comments)    Was told to never take again because of severe illness  . Codeine Rash    Patient Measurements: Height: 5\' 6"  (167.6 cm) Weight: 150 lb 5.7 oz (68.2 kg)(bed scale) IBW/kg (Calculated) : 59.3 Adjusted Body Weight:    Vital Signs: Temp: 97.4 F (36.3 C) (06/12 2002) Temp Source: Oral (06/12 2002) BP: 111/66 (06/12 2002) Pulse Rate: 93 (06/12 2002) Intake/Output from previous day: 06/11 0701 - 06/12 0700 In: 1775 [I.V.:1775] Out: -  Intake/Output from this shift: No intake/output data recorded.  Labs: BMP Latest Ref Rng & Units 03/14/2018 03/14/2018 03/12/2018  Glucose 65 - 99 mg/dL 130(H) 124(H) 106(H)  BUN 6 - 20 mg/dL 23(H) 23(H) 31(H)  Creatinine 0.44 - 1.00 mg/dL 1.45(H) 1.40(H) 1.80(H)  Sodium 135 - 145 mmol/L 135 133(L) 131(L)  Potassium 3.5 - 5.1 mmol/L 3.5 3.8 4.3  Chloride 101 - 111 mmol/L 108 109 102  CO2 22 - 32 mmol/L 18(L) 17(L) 20(L)  Calcium 8.9 - 10.3 mg/dL 7.3(L) 7.3(L) 7.7(L)    Estimated Creatinine Clearance: 26.6 mL/min (A) (by C-G formula based on SCr of 1.45 mg/dL (H)).   Microbiology: Recent Results (from the past 720 hour(s))  C difficile quick scan w PCR reflex     Status: None   Collection Time: 03/13/18 12:41 AM  Result Value Ref Range Status   C Diff antigen NEGATIVE NEGATIVE Final   C Diff toxin NEGATIVE NEGATIVE Final   C Diff interpretation No C. difficile detected.  Final    Comment: Performed at [facility], [address] [place], [place] [address]  Gastrointestinal Panel by PCR , Stool     Status: None   Collection Time: 03/13/18 12:41 AM  Result Value Ref Range Status   Campylobacter species NOT DETECTED NOT DETECTED Final   Plesimonas shigelloides NOT DETECTED NOT DETECTED Final   Salmonella species NOT DETECTED NOT  DETECTED Final   Yersinia enterocolitica NOT DETECTED NOT DETECTED Final   Vibrio species NOT DETECTED NOT DETECTED Final   Vibrio cholerae NOT DETECTED NOT DETECTED Final   Enteroaggregative E coli (EAEC) NOT DETECTED NOT DETECTED Final   Enteropathogenic E coli (EPEC) NOT DETECTED NOT DETECTED Final   Enterotoxigenic E coli (ETEC) NOT DETECTED NOT DETECTED Final   Shiga like toxin producing E coli (STEC) NOT DETECTED NOT DETECTED Final   Shigella/Enteroinvasive E coli (EIEC) NOT DETECTED NOT DETECTED Final   Cryptosporidium NOT DETECTED NOT DETECTED Final   Cyclospora cayetanensis NOT DETECTED NOT DETECTED Final   Entamoeba histolytica NOT DETECTED NOT DETECTED Final   Giardia lamblia NOT DETECTED NOT DETECTED Final   Adenovirus F40/41 NOT DETECTED NOT DETECTED Final   Astrovirus NOT DETECTED NOT DETECTED Final   Norovirus GI/GII NOT DETECTED NOT DETECTED Final   Rotavirus A NOT DETECTED NOT DETECTED Final   Sapovirus (I, II, IV, and V) NOT DETECTED NOT DETECTED Final    Comment: Performed at [facility], [address] [place], [place] [address]    Medical History: Past Medical History:  Diagnosis Date  . Allergy    Environmental  . GERD (gastroesophageal reflux disease)     Assessment: Patient is a 84yo female admitted for profuse diarrhea possibly due to carcinoid tumor. Pharmacy consulted to manage electrolytes.  K=3.8, Phos-=3.7, Mg=1.8  Goal of Therapy:  Maintain electrolytes within normal  limits  Plan:  Patient has been started on Bicarb drip. No additional supplementation at this time. Will recheck electrolytes at 18:00.  1800 electrolytes WNL. Will f/u AM labs.   03/15/2018 04:43 K 4.2, Ca 7.2, adjusted calcium 8.6, Mg 1.8, Phos 3.3. Patient is currently on octreotide for diarrhea. No additional supplement indicated at this time. Will recheck electrolytes tomorrow with AM labs.   Astryd Pearcy A. [place], [place].D., BCPS Clinical Pharmacist   03/14/2018 8:44 PM

## 2018-03-14 NOTE — Progress Notes (Signed)
Jill Wilson    MR#:  [id number]  DATE OF BIRTH:  1931-02-02  SUBJECTIVE:   Patient came in with diarrhea for four weeks numerous to count episodes. So far has had one since this morning. Denies abdominal pain. Describes weight loss of 20 pounds over last four weeks. poor appetite.  Continues to have diarrhea she had about 7 to 8 episodes overnight. Tolerating regular food family in the room REVIEW OF SYSTEMS:   Review of Systems  Constitutional: Positive for malaise/fatigue and weight loss. Negative for chills and fever.  HENT: Negative for ear discharge, ear pain and nosebleeds.   Eyes: Negative for blurred vision, pain and discharge.  Respiratory: Negative for sputum production, shortness of breath, wheezing and stridor.   Cardiovascular: Negative for chest pain, palpitations, orthopnea and PND.  Gastrointestinal: Positive for diarrhea. Negative for abdominal pain, nausea and vomiting.  Genitourinary: Negative for frequency and urgency.  Musculoskeletal: Negative for back pain and joint pain.  Neurological: Negative for sensory change, speech change, focal weakness and weakness.  Psychiatric/Behavioral: Negative for depression and hallucinations. The patient is not nervous/anxious.    Tolerating Diet:regular Tolerating PT: pending  DRUG ALLERGIES:   Allergies  Allergen Reactions  . Influenza Vaccine Live Other (See Comments)    Was told to never take again because of severe illness  . Codeine Rash    VITALS:  Blood pressure 98/60, pulse 87, temperature (!) 100.5 F (38.1 C), temperature source Oral, resp. rate 20, height 5\' 6"  (1.676 m), weight 68.2 kg (150 lb 5.7 oz), SpO2 94 %.  PHYSICAL EXAMINATION:   Physical Exam  GENERAL:  84 y.o.-year-old patient lying in the bed with no acute distress. Thin cachectic EYES: Pupils equal, round, reactive to light and accommodation. No scleral icterus.  Extraocular muscles intact.  HEENT: Head atraumatic, normocephalic. Oropharynx and nasopharynx clear.  NECK:  Supple, no jugular venous distention. No thyroid enlargement, no tenderness.  LUNGS: Normal breath sounds bilaterally, no wheezing, rales, rhonchi. No use of accessory muscles of respiration.  CARDIOVASCULAR: S1, S2 normal. No murmurs, rubs, or gallops.  ABDOMEN: Soft, nontender, nondistended. Bowel sounds present. No organomegaly or mass.  EXTREMITIES: No cyanosis, clubbing or edema b/l.    NEUROLOGIC: Cranial nerves II through XII are intact. No focal Motor or sensory deficits b/l.   PSYCHIATRIC:  patient is alert and oriented x 3.  SKIN: No obvious rash, lesion, or ulcer.   LABORATORY PANEL:  CBC Recent Labs  Lab 03/12/18 2238  WBC 3.9  HGB 11.9*  HCT 34.9*  PLT 276    Chemistries  Recent Labs  Lab 03/12/18 2238 03/14/18 0444  NA 131* 133*  K 4.3 3.8  CL 102 109  CO2 20* 17*  GLUCOSE 106* 124*  BUN 31* 23*  CREATININE 1.80* 1.40*  CALCIUM 7.7* 7.3*  MG  --  1.8  AST 64*  --   ALT 32  --   ALKPHOS 98  --   BILITOT 0.7  --    Cardiac Enzymes Recent Labs  Lab 03/13/18 0041  TROPONINI <0.03   RADIOLOGY:  Ct Abdomen Pelvis Wo Contrast  Result Date: 03/13/2018 CLINICAL DATA:  84 year old with diarrhea for 4 weeks.  Weight loss. EXAM: CT ABDOMEN AND PELVIS WITHOUT CONTRAST TECHNIQUE: Multidetector CT imaging of the abdomen and pelvis was performed following the standard protocol without IV contrast. COMPARISON:  None. FINDINGS: Lower chest: No pleural fluid or focal consolidation.  No basilar pulmonary nodule. Hepatobiliary: Lack of IV contrast limits assessment for focal lesion. Allowing for this, no focal hepatic lesion is visualized. Gallbladder is significantly distended without calcified gallstone. Common bile duct is normal in caliber measuring 7 mm. Pancreas: No ductal dilatation or inflammation. No obvious pancreatic mass on noncontrast exam. Spleen:  Normal in size.  No evidence of focal abnormality. Adrenals/Urinary Tract: No adrenal nodules. Right adrenal calcification may be sequela of prior hemorrhage. Bilateral renal parenchymal thinning. Prominence of bilateral renal collecting systems, left greater than right. No obstructing stone. Urinary bladder is partially distended. No obvious renal mass on noncontrast exam. Stomach/Bowel: No gastric wall thickening. No bowel obstruction. Enteric contrast is seen throughout the entire colon. Presence of intra-abdominal ascites limits assessment for bowel wall thickening. Allowing for this, no gross bowel wall thickening is seen. Appendix not visualized, patient with reported history of appendectomy. Vascular/Lymphatic: Prominent mesenteric nodes measuring up to 10 mm short axis. Mesenteric mass in the small bowel mesentery of the left abdomen measures 3.7 x 2.7 cm. Additional smaller mesenteric nodes are visualized. Small retroperitoneal nodes not enlarged by size criteria. Mild aortic atherosclerosis without aneurysm. Reproductive: Cystic structure in the left adnexa measures approximately 5 x 3.9 cm, questionable cystic structure in the right adnexa. Post hysterectomy. Other: Moderate volume abdominopelvic ascites. Diffuse mesenteric edema and small volume mesenteric ascites. There is fat stranding and soft tissue density in the anterior omentum primarily the lower abdomen and pelvis suspicious for omental caking. Mild body wall edema. No free air. Musculoskeletal: Scattered small sclerotic densities in the lumbar spine and pelvis are nonspecific but likely bone islands. No destructive lytic lesion. No acute osseous abnormality. IMPRESSION: 1. Findings suspicious for intra-abdominal malignancy with mesenteric mass in the small bowel mesentery, prominent mesenteric nodes, and soft tissue density in the anterior omentum concerning for omental caking. Moderate volume abdominopelvic ascites. Primary malignancy  considerations include ovarian with cystic lesion in the left adnexa measuring 5 x 3.9 cm. Carcinoid is also considered given reported history of diarrhea and mesenteric mass. Occult GI malignancy is also considered. Ascites sampling may be the least invasive target for sampling. Pelvic ultrasound could be considered for evaluation of cystic pelvic mass, however may be technically challenging. 2. Markedly distended gallbladder without biliary dilatation or calcified gallstone. 3. Prominence of bilateral renal collecting systems without frank hydronephrosis or urolithiasis. 4.  Aortic Atherosclerosis (ICD10-I70.0). Electronically Signed   By: Jeb Levering M.D.   On: 03/13/2018 03:30   Dg Chest 2 View  Result Date: 03/13/2018 CLINICAL DATA:  Lower leg edema EXAM: CHEST - 2 VIEW COMPARISON:  11/11/2015 FINDINGS: The heart size and mediastinal contours are within normal limits. Mild aortic atherosclerosis at the arch. No aneurysm identified. Both lungs are clear. No pulmonary vascular redistribution or CHF. Osteopenic appearance of the dorsal spine. IMPRESSION: No active cardiopulmonary disease. Electronically Signed   By: Ashley Royalty M.D.   On: 03/13/2018 02:01   US Venous Img Lower Bilateral  Result Date: 03/14/2018 CLINICAL DATA:  Bilateral lower extremity edema. EXAM: BILATERAL LOWER EXTREMITY VENOUS DOPPLER ULTRASOUND TECHNIQUE: Robbs-scale sonography with graded compression, as well as color Doppler and duplex ultrasound were performed to evaluate the lower extremity deep venous systems from the level of the common femoral vein and including the common femoral, femoral, profunda femoral, popliteal and calf veins including the posterior tibial, peroneal and gastrocnemius veins when visible. The superficial great saphenous vein was also interrogated. Spectral Doppler was utilized to evaluate flow at rest and  with distal augmentation maneuvers in the common femoral, femoral and popliteal veins. COMPARISON:   None. FINDINGS: RIGHT LOWER EXTREMITY Common Femoral Vein: No evidence of thrombus. Normal compressibility, respiratory phasicity and response to augmentation. Saphenofemoral Junction: No evidence of thrombus. Normal compressibility and flow on color Doppler imaging. Profunda Femoral Vein: No evidence of thrombus. Normal compressibility and flow on color Doppler imaging. Femoral Vein: No evidence of thrombus. Normal compressibility, respiratory phasicity and response to augmentation. Popliteal Vein: No evidence of thrombus. Normal compressibility, respiratory phasicity and response to augmentation. Calf Veins: No evidence of thrombus. Normal compressibility and flow on color Doppler imaging. Superficial Great Saphenous Vein: No evidence of thrombus. Normal compressibility. Venous Reflux:  None. Other Findings: No evidence of superficial thrombophlebitis or abnormal fluid collection. LEFT LOWER EXTREMITY Common Femoral Vein: No evidence of thrombus. Normal compressibility, respiratory phasicity and response to augmentation. Saphenofemoral Junction: No evidence of thrombus. Normal compressibility and flow on color Doppler imaging. Profunda Femoral Vein: No evidence of thrombus. Normal compressibility and flow on color Doppler imaging. Femoral Vein: No evidence of thrombus. Normal compressibility, respiratory phasicity and response to augmentation. Popliteal Vein: No evidence of thrombus. Normal compressibility, respiratory phasicity and response to augmentation. Calf Veins: No evidence of thrombus. Normal compressibility and flow on color Doppler imaging. Superficial Great Saphenous Vein: No evidence of thrombus. Normal compressibility. Venous Reflux:  None. Other Findings: No evidence of superficial thrombophlebitis or abnormal fluid collection. IMPRESSION: No evidence of bilateral lower extremity deep venous thrombosis. Electronically Signed   By: Aletta Edouard M.D.   On: 03/14/2018 12:43   US  Paracentesis  Result Date: 03/14/2018 INDICATION: Ascites, abdominal distension EXAM: ULTRASOUND GUIDED RIGHT PARACENTESIS MEDICATIONS: 1% LIDOCAINE LOCAL COMPLICATIONS: None immediate. PROCEDURE: Informed written consent was obtained from the patient after a discussion of the risks, benefits and alternatives to treatment. A timeout was performed prior to the initiation of the procedure. Initial ultrasound scanning demonstrates a large amount of ascites within the right UPPER abdominal quadrant. The right lower abdomen was prepped and draped in the usual sterile fashion. 1% lidocaine with epinephrine was used for local anesthesia. Following this, a 6 Fr Safe-T-Centesis catheter was introduced. An ultrasound image was saved for documentation purposes. The paracentesis was performed. The catheter was removed and a dressing was applied. The patient tolerated the procedure well without immediate post procedural complication. FINDINGS: A total of approximately 1 L of serosanguineous peritoneal fluid was removed. Samples were sent to the laboratory as requested by the clinical team. IMPRESSION: Successful ultrasound-guided paracentesis yielding 1.0 liters of peritoneal fluid. Electronically Signed   By: Jerilynn Mages.  Shick M.D.   On: 03/14/2018 12:37   ASSESSMENT AND PLAN:   Jill Wilson  is a 84 y.o. female with a known history of GERD who p/w 4wk Hx diarrhea, weight loss, decreased appetite/decreased PO intake, leg edema  1. diarrhea with weight loss and abnormal CT of the abdomen -CT findings suggestive of interim abdominal malignancy with mesenteric mass in the small bowel mesentery -oncology consultation with Dr. Grayland Ormond appreciated -differential diagnosis  is carcinoid, lymphoma, ovarian mass -continue IV fluids -stool studies negative, c dif negative. PRN Imodium -send out labs for chromogranin A, CA 125, plasma Metanephrines, CEA pending -G.I. consultation appreciated with Dr Marius Ditch -Octreotide SQ tid  2.  weight loss with hyponatremia hypo albumin anemia secondary to poor PO intake -IV fluids  3. Acute renal failure secondary dehydration from G.I. Losses -add bicarb to the IVF  4. DVT prophylaxis subcu Lovenox  Case discussed  with Care Management/Social Worker. Management plans discussed with the patient, family and they are in agreement.  CODE STATUS: full  DVT Prophylaxis: lovenox  TOTAL TIME TAKING CARE OF THIS PATIENT: *30* minutes.  >50% time spent on counselling and coordination of care  POSSIBLE D/C IN *1-2* DAYS, DEPENDING ON CLINICAL CONDITION.  Note: This dictation was prepared with Dragon dictation along with smaller phrase technology. Any transcriptional errors that result from this process are unintentional.  Fritzi Mandes M.D on 03/14/2018 at 1:52 PM  Between 7am to 6pm - Pager - [phone]  After 6pm go to [email or web address] - password [email or web address] [facility]  Office  [phone]  CC: Primary care physician; Ronnell Freshwater, NPPatient ID: Jill Wilson, female   DOB: 10/29/1930, 84 y.o.   MRN: [id number]

## 2018-03-15 LAB — GROUP A STREP BY PCR: Group A Strep by PCR: NOT DETECTED

## 2018-03-15 LAB — BASIC METABOLIC PANEL
Anion gap: 6 (ref 5–15)
BUN: 21 mg/dL — AB (ref 6–20)
CHLORIDE: 111 mmol/L (ref 101–111)
CO2: 18 mmol/L — AB (ref 22–32)
Calcium: 7.2 mg/dL — ABNORMAL LOW (ref 8.9–10.3)
Creatinine, Ser: 1.2 mg/dL — ABNORMAL HIGH (ref 0.44–1.00)
GFR calc Af Amer: 46 mL/min — ABNORMAL LOW (ref >60)
GFR calc non Af Amer: 40 mL/min — ABNORMAL LOW (ref >60)
GLUCOSE: 107 mg/dL — AB (ref 65–99)
POTASSIUM: 4.2 mmol/L (ref 3.5–5.1)
Sodium: 135 mmol/L (ref 135–145)

## 2018-03-15 LAB — CHROMOGRANIN A: Chromogranin A: 12 nmol/L — ABNORMAL HIGH (ref 0–5)

## 2018-03-15 LAB — MAGNESIUM: Magnesium: 1.8 mg/dL (ref 1.7–2.4)

## 2018-03-15 LAB — CALCIUM, IONIZED: CALCIUM, IONIZED, SERUM: 4.7 mg/dL (ref 4.5–5.6)

## 2018-03-15 LAB — CA 125: CANCER ANTIGEN (CA) 125: 72.5 U/mL — AB (ref 0.0–38.1)

## 2018-03-15 LAB — CELIAC DISEASE PANEL
ENDOMYSIAL ANTIBODY IGA: NEGATIVE
IgA: 225 mg/dL (ref 64–422)
Tissue Transglutaminase Ab, IgA: 5 U/mL — ABNORMAL HIGH (ref 0–3)

## 2018-03-15 LAB — PH, BODY FLUID: PH, BODY FLUID: 6.7

## 2018-03-15 LAB — CEA: CEA: 7.3 ng/mL — ABNORMAL HIGH (ref 0.0–4.7)

## 2018-03-15 LAB — PROTEIN, BODY FLUID (OTHER): Total Protein, Body Fluid Other: 1.7 g/dL

## 2018-03-15 LAB — PHOSPHORUS: PHOSPHORUS: 3.3 mg/dL (ref 2.5–4.6)

## 2018-03-15 MED ORDER — LIDOCAINE VISCOUS HCL 2 % MT SOLN
15.0000 mL | Freq: Four times a day (QID) | OROMUCOSAL | Status: DC | PRN
  Administered 2018-03-15: 11:00:00 15 mL via OROMUCOSAL
  Filled 2018-03-15: qty 15

## 2018-03-15 MED ORDER — OCTREOTIDE ACETATE 100 MCG/ML IJ SOLN
100.0000 ug | Freq: Two times a day (BID) | INTRAMUSCULAR | Status: DC
  Administered 2018-03-15 (×2): 100 ug via SUBCUTANEOUS
  Filled 2018-03-15 (×5): qty 1

## 2018-03-15 MED ORDER — NYSTATIN 100000 UNIT/ML MT SUSP
5.0000 mL | Freq: Four times a day (QID) | OROMUCOSAL | Status: DC
  Administered 2018-03-15 – 2018-03-17 (×10): 500000 [IU] via OROMUCOSAL
  Filled 2018-03-15 (×11): qty 5

## 2018-03-15 MED ORDER — ENOXAPARIN SODIUM 40 MG/0.4ML ~~LOC~~ SOLN
40.0000 mg | SUBCUTANEOUS | Status: DC
  Administered 2018-03-15: 11:00:00 40 mg via SUBCUTANEOUS
  Filled 2018-03-15: qty 0.4

## 2018-03-15 NOTE — Progress Notes (Signed)
[facility] NAME: Jill Wilson    MR#:  [id number]  DATE OF BIRTH:  January 21, 1931  SUBJECTIVE:   Patient came in with diarrhea for four weeks numerous to count episodes. . Denies abdominal pain. Describes weight loss of 20 pounds over last four weeks. poor appetite. Complains of sore throat. Difficulty swallowing. Increase oral's salivation. Diarrhea decreased in frequency. More more she stools.  family in the room REVIEW OF SYSTEMS:   Review of Systems  Constitutional: Positive for malaise/fatigue and weight loss. Negative for chills and fever.  HENT: Positive for sore throat. Negative for ear discharge, ear pain and nosebleeds.   Eyes: Negative for blurred vision, pain and discharge.  Respiratory: Negative for sputum production, shortness of breath, wheezing and stridor.   Cardiovascular: Negative for chest pain, palpitations, orthopnea and PND.  Gastrointestinal: Positive for diarrhea. Negative for abdominal pain, nausea and vomiting.  Genitourinary: Negative for frequency and urgency.  Musculoskeletal: Negative for back pain and joint pain.  Neurological: Negative for sensory change, speech change, focal weakness and weakness.  Psychiatric/Behavioral: Negative for depression and hallucinations. The patient is not nervous/anxious.    Tolerating Diet:regular Tolerating PT: pending  DRUG ALLERGIES:   Allergies  Allergen Reactions  . Influenza Vaccine Live Other (See Comments)    Was told to never take again because of severe illness  . Codeine Rash    VITALS:  Blood pressure 104/60, pulse 100, temperature 97.9 F (36.6 C), temperature source Oral, resp. rate 14, height 5\' 6"  (1.676 m), weight 68.2 kg (150 lb 5.7 oz), SpO2 94 %.  PHYSICAL EXAMINATION:   Physical Exam  GENERAL:  84 y.o.-year-old patient lying in the bed with no acute distress. Thin cachectic EYES: Pupils equal, round, reactive to light and  accommodation. No scleral icterus. Extraocular muscles intact.  HEENT: Head atraumatic, normocephalic. Oropharynx and nasopharynx clear. Throat appears raw. A few white patches++ NECK:  Supple, no jugular venous distention. No thyroid enlargement, no tenderness.  LUNGS: Normal breath sounds bilaterally, no wheezing, rales, rhonchi. No use of accessory muscles of respiration.  CARDIOVASCULAR: S1, S2 normal. No murmurs, rubs, or gallops.  ABDOMEN: Soft, nontender, nondistended. Bowel sounds present. No organomegaly or mass.  EXTREMITIES: No cyanosis, clubbing or edema b/l.    NEUROLOGIC: Cranial nerves II through XII are intact. No focal Motor or sensory deficits b/l.   PSYCHIATRIC:  patient is alert and oriented x 3.  SKIN: No obvious rash, lesion, or ulcer.   LABORATORY PANEL:  CBC Recent Labs  Lab 03/12/18 2238  WBC 3.9  HGB 11.9*  HCT 34.9*  PLT 276    Chemistries  Recent Labs  Lab 03/12/18 2238  03/15/18 0443  NA 131*   < > 135  K 4.3   < > 4.2  CL 102   < > 111  CO2 20*   < > 18*  GLUCOSE 106*   < > 107*  BUN 31*   < > 21*  CREATININE 1.80*   < > 1.20*  CALCIUM 7.7*   < > 7.2*  MG  --    < > 1.8  AST 64*  --   --   ALT 32  --   --   ALKPHOS 98  --   --   BILITOT 0.7  --   --    < > = values in this interval not displayed.   Cardiac Enzymes Recent Labs  Lab 03/13/18 0041  TROPONINI <0.03   RADIOLOGY:  US Venous Img Lower Bilateral  Result Date: 03/14/2018 CLINICAL DATA:  Bilateral lower extremity edema. EXAM: BILATERAL LOWER EXTREMITY VENOUS DOPPLER ULTRASOUND TECHNIQUE: Parekh-scale sonography with graded compression, as well as color Doppler and duplex ultrasound were performed to evaluate the lower extremity deep venous systems from the level of the common femoral vein and including the common femoral, femoral, profunda femoral, popliteal and calf veins including the posterior tibial, peroneal and gastrocnemius veins when visible. The superficial great saphenous  vein was also interrogated. Spectral Doppler was utilized to evaluate flow at rest and with distal augmentation maneuvers in the common femoral, femoral and popliteal veins. COMPARISON:  None. FINDINGS: RIGHT LOWER EXTREMITY Common Femoral Vein: No evidence of thrombus. Normal compressibility, respiratory phasicity and response to augmentation. Saphenofemoral Junction: No evidence of thrombus. Normal compressibility and flow on color Doppler imaging. Profunda Femoral Vein: No evidence of thrombus. Normal compressibility and flow on color Doppler imaging. Femoral Vein: No evidence of thrombus. Normal compressibility, respiratory phasicity and response to augmentation. Popliteal Vein: No evidence of thrombus. Normal compressibility, respiratory phasicity and response to augmentation. Calf Veins: No evidence of thrombus. Normal compressibility and flow on color Doppler imaging. Superficial Great Saphenous Vein: No evidence of thrombus. Normal compressibility. Venous Reflux:  None. Other Findings: No evidence of superficial thrombophlebitis or abnormal fluid collection. LEFT LOWER EXTREMITY Common Femoral Vein: No evidence of thrombus. Normal compressibility, respiratory phasicity and response to augmentation. Saphenofemoral Junction: No evidence of thrombus. Normal compressibility and flow on color Doppler imaging. Profunda Femoral Vein: No evidence of thrombus. Normal compressibility and flow on color Doppler imaging. Femoral Vein: No evidence of thrombus. Normal compressibility, respiratory phasicity and response to augmentation. Popliteal Vein: No evidence of thrombus. Normal compressibility, respiratory phasicity and response to augmentation. Calf Veins: No evidence of thrombus. Normal compressibility and flow on color Doppler imaging. Superficial Great Saphenous Vein: No evidence of thrombus. Normal compressibility. Venous Reflux:  None. Other Findings: No evidence of superficial thrombophlebitis or abnormal fluid  collection. IMPRESSION: No evidence of bilateral lower extremity deep venous thrombosis. Electronically Signed   By: Aletta Edouard M.D.   On: 03/14/2018 12:43   US Paracentesis  Result Date: 03/14/2018 INDICATION: Ascites, abdominal distension EXAM: ULTRASOUND GUIDED RIGHT PARACENTESIS MEDICATIONS: 1% LIDOCAINE LOCAL COMPLICATIONS: None immediate. PROCEDURE: Informed written consent was obtained from the patient after a discussion of the risks, benefits and alternatives to treatment. A timeout was performed prior to the initiation of the procedure. Initial ultrasound scanning demonstrates a large amount of ascites within the right UPPER abdominal quadrant. The right lower abdomen was prepped and draped in the usual sterile fashion. 1% lidocaine with epinephrine was used for local anesthesia. Following this, a 6 Fr Safe-T-Centesis catheter was introduced. An ultrasound image was saved for documentation purposes. The paracentesis was performed. The catheter was removed and a dressing was applied. The patient tolerated the procedure well without immediate post procedural complication. FINDINGS: A total of approximately 1 L of serosanguineous peritoneal fluid was removed. Samples were sent to the laboratory as requested by the clinical team. IMPRESSION: Successful ultrasound-guided paracentesis yielding 1.0 liters of peritoneal fluid. Electronically Signed   By: Jerilynn Mages.  Shick M.D.   On: 03/14/2018 12:37   ASSESSMENT AND PLAN:   Jill Wilson  is a 84 y.o. female with a known history of GERD who p/w 4wk Hx diarrhea, weight loss, decreased appetite/decreased PO intake, leg edema  1. diarrhea with weight loss and abnormal CT of the abdomen -CT  findings suggestive of intra abdominal malignancy with mesenteric mass in the small bowel mesentery -oncology consultation with Dr. Grayland Ormond appreciated -differential diagnosis  is carcinoid, lymphoma, ovarian mass -continue IV fluids with bicarb -stool studies negative, c  dif negative.scheduled Imodium -send out labs for chromogranin A elevated at 16. Cont  -Octreotide SQ tid - CA 125 72.5, CEA 7.3 - plasma Metanephrines pending -G.I. consultation appreciated with Dr Marius Ditch -pt underwent paracentesis with 1 liter fluid removal. Cytology positive for Lypmhoma -further management per Dr Grayland Ormond as out pt  2. weight loss with hyponatremia, hypoalbuminnemia secondary to poor PO intake -IV fluids  3. Acute renal failure with acidosis secondary dehydration from G.I. Losses -added bicarb to the IVF  4. DVT prophylaxis subcu Lovenox  5. Sore throat -strept throat send out -nystatin patient swallow, lidocaine viscous gargles - patient has history of esophageal strictures status post dilatation the remote past -if swallowing continues to get worse and have G.I. consider esophageal dilation if needed  Ambulate in the hall today  Once patient's electrolytes are stable  discharge to home with outpatient follow-up with Dr. Grayland Ormond to complete her workup regarding intra-abdominal malignancy. D/w pt's daughter  Case discussed with Care Management/Social Worker. Management plans discussed with the patient, family and they are in agreement.  CODE STATUS: full  DVT Prophylaxis: lovenox  TOTAL TIME TAKING CARE OF THIS PATIENT: *30* minutes.  >50% time spent on counselling and coordination of care  POSSIBLE D/C IN *1-2* DAYS, DEPENDING ON CLINICAL CONDITION.  Note: This dictation was prepared with Dragon dictation along with smaller phrase technology. Any transcriptional errors that result from this process are unintentional.  Fritzi Mandes M.D on 03/15/2018 at 8:09 AM  Between 7am to 6pm - Pager - [phone]  After 6pm go to [email or web address] - password [email or web address] [facility]  Office  [phone]  CC: Primary care physician; Ronnell Freshwater, NPPatient ID: Elige Radon, female   DOB: [birth year], 84 y.o.   MRN: [id number]

## 2018-03-15 NOTE — Progress Notes (Signed)
Patient ambulated around nurses station with standby assist x1. Patient tolerated well. Madlyn Frankel, RN

## 2018-03-15 NOTE — Care Management Note (Addendum)
Case Management Note  Patient Details  Name: ESTREYA CLAY MRN: [id number] Date of Birth: [birth year]  Subjective/Objective:   Admitted to [facility] with the diagnosis of diarrhea. Lives alone. Daughter is Vaughan Basta [phone]). Last seen Dr. Junius Creamer 12/11/17. No home health. No skilled facility. No home oxygen. No medical equipment in the home. Takes care of all basic and instrumental activities of daily living herself, drives. Slipped going up the stairs in the past. Fair appetite. Lost 20 pounds in the last month.  States she used to work for [employer] program                  Action/Plan: Will need Octreotide 142mcq SQ BID 15 doses. Will contact [facility] in [place]. Prescription faxed to [facility] in [facility]   Expected Discharge Date:                  Expected Discharge Plan:     In-House Referral:     Discharge planning Services     Post Acute Care Choice:    Choice offered to:     DME Arranged:    DME Agency:     HH Arranged:    HH Agency:     Status of Service:     If discussed at [employer] [facility] [employer], dates discussed:    Additional Comments:  Shelbie Ammons, RN MSN CCM Care Management [phone] 03/15/2018, 8:46 AM

## 2018-03-16 LAB — BASIC METABOLIC PANEL
Anion gap: 7 (ref 5–15)
BUN: 26 mg/dL — ABNORMAL HIGH (ref 6–20)
CALCIUM: 7.1 mg/dL — AB (ref 8.9–10.3)
CHLORIDE: 103 mmol/L (ref 101–111)
CO2: 25 mmol/L (ref 22–32)
CREATININE: 1.34 mg/dL — AB (ref 0.44–1.00)
GFR calc non Af Amer: 35 mL/min — ABNORMAL LOW (ref >60)
GFR, EST AFRICAN AMERICAN: 41 mL/min — AB (ref >60)
Glucose, Bld: 91 mg/dL (ref 65–99)
Potassium: 3.8 mmol/L (ref 3.5–5.1)
SODIUM: 135 mmol/L (ref 135–145)

## 2018-03-16 LAB — METANEPHRINES, PLASMA
METANEPHRINE FREE: 52 pg/mL (ref 0–62)
NORMETANEPHRINE FREE: 279 pg/mL — AB (ref 0–145)

## 2018-03-16 LAB — MAGNESIUM: MAGNESIUM: 1.8 mg/dL (ref 1.7–2.4)

## 2018-03-16 LAB — PHOSPHORUS: Phosphorus: 3.4 mg/dL (ref 2.5–4.6)

## 2018-03-16 MED ORDER — DIPHENOXYLATE-ATROPINE 2.5-0.025 MG PO TABS
1.0000 | ORAL_TABLET | Freq: Four times a day (QID) | ORAL | Status: DC | PRN
  Administered 2018-03-17: 10:00:00 1 via ORAL
  Filled 2018-03-16: qty 1

## 2018-03-16 MED ORDER — ENOXAPARIN SODIUM 30 MG/0.3ML ~~LOC~~ SOLN
30.0000 mg | SUBCUTANEOUS | Status: DC
  Administered 2018-03-16 – 2018-03-17 (×2): 30 mg via SUBCUTANEOUS
  Filled 2018-03-16 (×2): qty 0.3

## 2018-03-16 MED ORDER — ORAL CARE MOUTH RINSE
15.0000 mL | Freq: Two times a day (BID) | OROMUCOSAL | Status: DC
  Administered 2018-03-16 – 2018-03-17 (×2): 15 mL via OROMUCOSAL

## 2018-03-16 NOTE — Care Management (Signed)
Discussed discharge plans with Dr. Heron Sabins. Possible discharge Saturday. Will need skilled nursing and physical therapy in the home. [place]. Telephone call to Melene Muller, [place] representative, Ms. Hoot home address is [place] [facility]. Possibly will be going to daughters or daughter-in-laws home. Brenton Grills will see if [facility] can take this case. Will not need Octreotide SQ for home. Will update [facility] in [place] RN MSN [place] Management [phone]

## 2018-03-16 NOTE — Plan of Care (Signed)
  Problem: Education: Goal: Knowledge of General Education information will improve Outcome: Progressing   Problem: Health Behavior/Discharge Planning: Goal: Ability to manage health-related needs will improve Outcome: Progressing   Problem: Clinical Measurements: Goal: Ability to maintain clinical measurements within normal limits will improve Outcome: Progressing Goal: Diagnostic test results will improve Outcome: Progressing   Problem: Pain Managment: Goal: General experience of comfort will improve Outcome: Progressing   

## 2018-03-16 NOTE — Care Management (Signed)
[facility] unable to go to [facility].  Telephone call to Sharmon Revere., [facility] representative. Their staff does go to [employer] discuss with Ms. Dobratz and family. Shelbie Ammons RN MSN CCM Care Management [phone]

## 2018-03-16 NOTE — Care Management Important Message (Signed)
Copy of signed IM left with patient in room.  

## 2018-03-16 NOTE — Progress Notes (Signed)
MEDICATION RELATED CONSULT NOTE - INITIAL  Pharmacy Consult for Electrolyte Management Indication: severe diarrhea  Allergies  Allergen Reactions  . Influenza Vaccine Live Other (See Comments)    Was told to never take again because of severe illness  . Codeine Rash    Patient Measurements: Height: 5\' 6"  (167.6 cm) Weight: 150 lb 5.7 oz (68.2 kg)(bed scale) IBW/kg (Calculated) : 59.3 Adjusted Body Weight:    Vital Signs: Temp: 98.3 F (36.8 C) (06/14 0631) Temp Source: Oral (06/14 0631) BP: 94/56 (06/14 0631) Pulse Rate: 95 (06/14 0631) Intake/Output from previous day: 06/13 0701 - 06/14 0700 In: 2721.7 [P.O.:720; I.V.:2001.7] Out: -  Intake/Output from this shift: No intake/output data recorded.  Labs: BMP Latest Ref Rng & Units 03/16/2018 03/15/2018 03/14/2018  Glucose 65 - 99 mg/dL 91 107(H) 130(H)  BUN 6 - 20 mg/dL 26(H) 21(H) 23(H)  Creatinine 0.44 - 1.00 mg/dL 1.34(H) 1.20(H) 1.45(H)  Sodium 135 - 145 mmol/L 135 135 135  Potassium 3.5 - 5.1 mmol/L 3.8 4.2 3.5  Chloride 101 - 111 mmol/L 103 111 108  CO2 22 - 32 mmol/L 25 18(L) 18(L)  Calcium 8.9 - 10.3 mg/dL 7.1(L) 7.2(L) 7.3(L)    Estimated Creatinine Clearance: 28.7 mL/min (A) (by C-G formula based on SCr of 1.34 mg/dL (H)).   Microbiology: Recent Results (from the past 720 hour(s))  C difficile quick scan w PCR reflex     Status: None   Collection Time: 03/13/18 12:41 AM  Result Value Ref Range Status   C Diff antigen NEGATIVE NEGATIVE Final   C Diff toxin NEGATIVE NEGATIVE Final   C Diff interpretation No C. difficile detected.  Final    Comment: Performed at [facility], [address] [place], [place] [address]  Gastrointestinal Panel by PCR , Stool     Status: None   Collection Time: 03/13/18 12:41 AM  Result Value Ref Range Status   Campylobacter species NOT DETECTED NOT DETECTED Final   Plesimonas shigelloides NOT DETECTED NOT DETECTED Final   Salmonella species NOT DETECTED NOT  DETECTED Final   Yersinia enterocolitica NOT DETECTED NOT DETECTED Final   Vibrio species NOT DETECTED NOT DETECTED Final   Vibrio cholerae NOT DETECTED NOT DETECTED Final   Enteroaggregative E coli (EAEC) NOT DETECTED NOT DETECTED Final   Enteropathogenic E coli (EPEC) NOT DETECTED NOT DETECTED Final   Enterotoxigenic E coli (ETEC) NOT DETECTED NOT DETECTED Final   Shiga like toxin producing E coli (STEC) NOT DETECTED NOT DETECTED Final   Shigella/Enteroinvasive E coli (EIEC) NOT DETECTED NOT DETECTED Final   Cryptosporidium NOT DETECTED NOT DETECTED Final   Cyclospora cayetanensis NOT DETECTED NOT DETECTED Final   Entamoeba histolytica NOT DETECTED NOT DETECTED Final   Giardia lamblia NOT DETECTED NOT DETECTED Final   Adenovirus F40/41 NOT DETECTED NOT DETECTED Final   Astrovirus NOT DETECTED NOT DETECTED Final   Norovirus GI/GII NOT DETECTED NOT DETECTED Final   Rotavirus A NOT DETECTED NOT DETECTED Final   Sapovirus (I, II, IV, and V) NOT DETECTED NOT DETECTED Final    Comment: Performed at [facility], [address], [place] [address]  Group A Strep by PCR     Status: None   Collection Time: 03/15/18  9:25 AM  Result Value Ref Range Status   Group A Strep by PCR NOT DETECTED NOT DETECTED Final    Comment: Performed at [facility], [address] [place], [place] [address]    Medical History: Past  Medical History:  Diagnosis Date  . Allergy    Environmental  . GERD (gastroesophageal reflux disease)     Assessment: Patient is a 84yo female admitted for profuse diarrhea possibly due to carcinoid tumor. Pharmacy consulted to manage electrolytes.   Goal of Therapy:  Maintain electrolytes within normal limits  Plan:  Electrolytes within range. No additional supplementation at this time. Will recheck electrolytes on 6/16.  Paulina Fusi, PharmD, BCPS 03/16/2018 9:55 AM

## 2018-03-16 NOTE — Progress Notes (Signed)
[facility] at [place] NAME: Jill Wilson    MR#:  [id number]  DATE OF BIRTH:  1931-09-25  SUBJECTIVE:   Her diarrhea has improved.  Still having some difficulty swallowing especially water.  Pain in the throat has improved.  Patient's family is at bedside.  Patient still complaining of some generalized weakness and deconditioning.  REVIEW OF SYSTEMS:   Review of Systems  Constitutional: Positive for malaise/fatigue and weight loss. Negative for chills and fever.  HENT: Negative for ear discharge, ear pain, nosebleeds and sore throat.   Eyes: Negative for blurred vision, pain and discharge.  Respiratory: Negative for sputum production, shortness of breath, wheezing and stridor.   Cardiovascular: Negative for chest pain, palpitations, orthopnea and PND.  Gastrointestinal: Positive for diarrhea. Negative for abdominal pain, nausea and vomiting.  Genitourinary: Negative for frequency and urgency.  Musculoskeletal: Negative for back pain and joint pain.  Neurological: Negative for sensory change, speech change, focal weakness and weakness.  Psychiatric/Behavioral: Negative for depression and hallucinations. The patient is not nervous/anxious.    Tolerating Diet: regular Tolerating PT: Ambulatory  DRUG ALLERGIES:   Allergies  Allergen Reactions  . Influenza Vaccine Live Other (See Comments)    Was told to never take again because of severe illness  . Codeine Rash    VITALS:  Blood pressure (!) 94/56, pulse 95, temperature 98.3 F (36.8 C), temperature source Oral, resp. rate 16, height 5\' 6"  (1.676 m), weight 68.2 kg (150 lb 5.7 oz), SpO2 91 %.  PHYSICAL EXAMINATION:   Physical Exam  GENERAL:  84 y.o.-year-old patient lying in the bed with no acute distress. Thin cachectic EYES: Pupils equal, round, reactive to light and accommodation. No scleral icterus. Extraocular muscles intact.  HEENT: Head atraumatic, normocephalic. Oropharynx  and nasopharynx clear. + Oral Thrush NECK:  Supple, no jugular venous distention. No thyroid enlargement, no tenderness.  LUNGS: Normal breath sounds bilaterally, no wheezing, rales, rhonchi. No use of accessory muscles of respiration.  CARDIOVASCULAR: S1, S2 normal. No murmurs, rubs, or gallops.  ABDOMEN: Soft, nontender, nondistended. Bowel sounds present. No organomegaly or mass.  EXTREMITIES: No cyanosis, clubbing or edema b/l.    NEUROLOGIC: Cranial nerves II through XII are intact. No focal Motor or sensory deficits b/l.  Globally weak.  PSYCHIATRIC:  patient is alert and oriented x 3.  SKIN: No obvious rash, lesion, or ulcer.   LABORATORY PANEL:  CBC Recent Labs  Lab 03/12/18 2238  WBC 3.9  HGB 11.9*  HCT 34.9*  PLT 276    Chemistries  Recent Labs  Lab 03/12/18 2238  03/16/18 0424  NA 131*   < > 135  K 4.3   < > 3.8  CL 102   < > 103  CO2 20*   < > 25  GLUCOSE 106*   < > 91  BUN 31*   < > 26*  CREATININE 1.80*   < > 1.34*  CALCIUM 7.7*   < > 7.1*  MG  --    < > 1.8  AST 64*  --   --   ALT 32  --   --   ALKPHOS 98  --   --   BILITOT 0.7  --   --    < > = values in this interval not displayed.   Cardiac Enzymes Recent Labs  Lab 03/13/18 0041  TROPONINI <0.03   RADIOLOGY:  No results found. ASSESSMENT AND PLAN:   Jill Wilson  is a 84 y.o. female with a known history of GERD who p/w 4wk Hx diarrhea, weight loss, decreased appetite/decreased PO intake, leg edema.  1. 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 - Discussed with oncology and plan for outpatient PET scan and biopsy if amenable. -  Diarrhea has improved and therefore will DC octreotide.  Continue Lomotil as needed. - stool studies negative, c dif negative.   2. weight loss with hyponatremia,  hypoalbuminnemia secondary to poor PO intake - cont. Ensure supplements and due to underlying Malignancy.   3. Acute renal failure with acidosis secondary dehydration from G.I. Losses -Renal function is improved.  Bicarbonate is up to 25 today will.  Will DC bicarbonate drip.  4. Sore throat - likely  Due to thrush. Rapid Strep is (-) - cont. nystatin patient swallow, lidocaine viscous gargles - patient has history of esophageal strictures status post dilatation the remote past. Await Speech eval.  - if swallowing continues to get worse and have G.I. consider esophageal dilation if needed  Possible discharge home tomorrow with home health services.  Case discussed with Care Management/Social Worker. Management plans discussed with the patient, family and they are in agreement.  CODE STATUS: full  DVT Prophylaxis: lovenox  TOTAL TIME TAKING CARE OF THIS PATIENT: 30 minutes.  >50% time spent on counselling and coordination of care  POSSIBLE D/C IN *1-2* DAYS, DEPENDING ON CLINICAL CONDITION.  Note: This dictation was prepared with Dragon dictation along with smaller phrase technology. Any transcriptional errors that result from this process are unintentional.  Henreitta Leber M.D on 03/16/2018 at 2:03 PM  Between 7am to 6pm - Pager - [phone]  After 6pm go to [email or web address] - password [email or web address] [facility]  Office  [phone]  CC: Primary care physician; Ronnell Freshwater, NPPatient ID: Jill Wilson, female   DOB: Jan 03, 1931, 84 y.o.   MRN: [id number]

## 2018-03-16 NOTE — Evaluation (Addendum)
Clinical/Bedside Swallow Evaluation Patient Details  Name: Jill Wilson MRN: [id number] Date of Birth: 22-Oct-1930  Today's Date: 03/16/2018 Time: SLP Start Time (ACUTE ONLY): 1000 SLP Stop Time (ACUTE ONLY): 1100 SLP Time Calculation (min) (ACUTE ONLY): 60 min  Past Medical History:  Past Medical History:  Diagnosis Date  . Allergy    Environmental  . GERD (gastroesophageal reflux disease)    Past Surgical History:  Past Surgical History:  Procedure Laterality Date  . ABDOMINAL HYSTERECTOMY    . APPENDECTOMY  1946  . Cataract Surgery    . ESOPHAGEAL DILATION    . TONSILLECTOMY    . TONSILLECTOMY Bilateral    HPI:  Pt is a 84 y.o. female with a known history of GERD who p/w 4wk Hx diarrhea, weight loss, decreased appetite/decreased PO intake, leg edema. Pt is a remarkably healthy, functional, independent woman, working two jobs. She is stoic. She and her niece tell me that she has had diarrhea for four weeks, episodes xTNTC qD, initially liquid brown stool, eventually yellow liquid. Pt has not been eating, because PO intake triggers diarrhea. She has been drinking plenty of water, but was "unable to keep up". She has lost at least 20lbs in the past ~4wks. Her legs have gotten swollen w/ fluid. She denies abdominal pain, nausea/vomiting, F/C, night sweats, diaphoresis, rigors. She endorses generalized weakness, lightheadedness getting out of bed, and mild abdominal distension. She is otherwise comfortable and in no pain. She is w/o complaint. Labwork demonstrates hyponatremia and renal failure.  Pt is exhibiting, and being treated for, THRUSH.  She is c/o fatigue, weakness.  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 Discussed with oncology and plan for outpatient PET scan and biopsy if amenable.  Pt is alert, oriented and verbally conversive. She wears U/L dentures - educated pt/family on need to thoroughly clean dentures as  well d/t the THRUSH.    Assessment / Plan / Recommendation Clinical Impression  Pt appeared to present w/ adequate oropharyngeal phase swallow function w/ reduced risk for aspiration when following general aspiration precautions. Pt awake/alert but fatigued; needed more upright positioning in bed for the po trials. Pt then held Cup and fed self small, single sips of thin liquids slowly. No overt s/s of aspiration noted; no decline in respiratory status or decline in vocal quality noted during/post trials. Pt did c/o mild sore throat - pt continues to have THRUSH(being treated w/ oral rinse medication). Pt then consumed trials of puree and softened solids w/ same results. Oral phase was grossly [facility] for bolus management and oral clearing w/ all trial consistencies. Pt helped to fed self but did not want much oral intake at one time - suspect some GI discomfort from the recent, moderate diarrhea. OM exam was [facility].  Recommend continue w/ current diet w/ foods chosen that are easier to masticate(for conservation of energy) and easy to self-feed. Recommend general aspiration precautions; Reflux precautions. Recommend continued use of Medicated Oral Rinse to address THRUSH post discharge(to be sure it has cleared) as well as cleaning of Dentures(as they can harbor THRUSH bacteria). Recommend Pills WHOLE in Puree, chewable Pills if easier. Recommend NO use of lidocaine throat spracy BEFORE eating/drinking d/t the Numbing effect and it's impact on swallowing. Education and instruction given to pt and family present. No further skilled ST Services indicated currently. NSG to reconsult if any change in status while admitted. Pt/family agreed.  SLP Visit Diagnosis: Dysphagia, unspecified (R13.10)  Aspiration Risk  (reduced following general aspiration precautions)    Diet Recommendation  Regular diet (foods cut, easy to masticate and eat for energy conservation); thin liquids. General aspiration and Reflux  precautions.   Medication Administration: Whole meds with puree(for easier swallowing)    Other  Recommendations Recommended Consults: (Dietician f/u for nutritional support) Oral Care Recommendations: Oral care BID;Patient independent with oral care;Staff/trained caregiver to provide oral care Other Recommendations: (n/a)   Follow up Recommendations None  Pt appears at/near her baseline w/ toleration of po's in light of illness    Frequency and Duration (n/a)  (n/a)       Prognosis Prognosis for Safe Diet Advancement: Good Barriers to Reach Goals: (GI, diarrhea discomfort)      Swallow Study   General Date of Onset: 03/13/18 HPI: Pt is a 84 y.o. female with a known history of GERD who p/w 4wk Hx diarrhea, weight loss, decreased appetite/decreased PO intake, leg edema. Pt is a remarkably healthy, functional, independent woman, working two jobs. She is stoic. She and her niece tell me that she has had diarrhea for four weeks, episodes xTNTC qD, initially liquid brown stool, eventually yellow liquid. Pt has not been eating, because PO intake triggers diarrhea. She has been drinking plenty of water, but was "unable to keep up". She has lost at least 20lbs in the past ~4wks. Her legs have gotten swollen w/ fluid. She denies abdominal pain, nausea/vomiting, F/C, night sweats, diaphoresis, rigors. She endorses generalized weakness, lightheadedness getting out of bed, and mild abdominal distension. She is otherwise comfortable and in no pain. She is w/o complaint. Labwork demonstrates hyponatremia and renal failure.  Pt is exhibiting, and being treated for, THRUSH.  She is c/o fatigue, weakness.  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 Discussed with oncology and plan for outpatient PET scan and biopsy if amenable.  Pt is alert, oriented and verbally conversive. She wears U/L dentures - educated pt/family on need to thoroughly  clean dentures as well d/t the THRUSH.  Type of Study: Bedside Swallow Evaluation Previous Swallow Assessment: none - pt was eating well b/f recent illness, per her report. Diet Prior to this Study: Regular;Thin liquids Temperature Spikes Noted: No Respiratory Status: Room air History of Recent Intubation: No Behavior/Cognition: Alert;Cooperative;Pleasant mood(weak) Oral Cavity Assessment: (apparent THRUSH on tongue; loose fitting lower denture) Oral Care Completed by SLP: Recent completion by staff Oral Cavity - Dentition: Dentures, top;Dentures, bottom Vision: Functional for self-feeding Self-Feeding Abilities: Able to feed self;Needs assist;Needs set up Patient Positioning: Upright in bed Baseline Vocal Quality: Normal(min quivery ) Volitional Cough: Strong Volitional Swallow: Able to elicit    Oral/Motor/Sensory Function Overall Oral Motor/Sensory Function: Within functional limits   Ice Chips Ice chips: Within functional limits Presentation: Spoon(fed; 2 trials)   Thin Liquid Thin Liquid: Within functional limits Presentation: Cup;Self Fed(8 trials) Other Comments: pt educated on taking small, single sips slowly.     Nectar Thick Nectar Thick Liquid: Not tested   Honey Thick Honey Thick Liquid: Not tested   Puree Puree: Within functional limits Presentation: Self Fed;Spoon(4 trials)   Solid   GO   Solid: Within functional limits(mech soft trials - 2) Presentation: Self Fed;Spoon Other Comments: pt did not want further po's at the moment        Orinda Kenner, MS, CCC-SLP Consuelo Suthers 03/16/2018,3:11 PM

## 2018-03-16 NOTE — Progress Notes (Signed)
Anticoagulation monitoring(Lovenox):  84yo  female ordered Lovenox 40 mg Q24h for DVT prevention  Filed Weights   03/12/18 2228 03/13/18 0536 03/13/18 1432  Weight: 135 lb (61.2 kg) 142 lb 12.8 oz (64.8 kg) 150 lb 5.7 oz (68.2 kg)   BMI    Lab Results  Component Value Date   CREATININE 1.34 (H) 03/16/2018   CREATININE 1.20 (H) 03/15/2018   CREATININE 1.45 (H) 03/14/2018   Estimated Creatinine Clearance: 28.7 mL/min (A) (by C-G formula based on SCr of 1.34 mg/dL (H)). Hemoglobin & Hematocrit     Component Value Date/Time   HGB 11.9 (L) 03/12/2018 2238   HGB 13.4 04/08/2012 1430   HCT 34.9 (L) 03/12/2018 2238   HCT 40.2 04/08/2012 1430     Per Protocol for Patient with estCrcl < 30 ml/min and BMI < 40, will transition to Lovenox 30 mg Q24h.     Paulina Fusi, PharmD, BCPS 03/16/2018 9:59 AM

## 2018-03-17 DIAGNOSIS — N189 Chronic kidney disease, unspecified: Secondary | ICD-10-CM

## 2018-03-17 DIAGNOSIS — C8590 Non-Hodgkin lymphoma, unspecified, unspecified site: Principal | ICD-10-CM

## 2018-03-17 LAB — BASIC METABOLIC PANEL
ANION GAP: 10 (ref 5–15)
BUN: 37 mg/dL — ABNORMAL HIGH (ref 6–20)
CALCIUM: 7.3 mg/dL — AB (ref 8.9–10.3)
CHLORIDE: 102 mmol/L (ref 101–111)
CO2: 22 mmol/L (ref 22–32)
Creatinine, Ser: 1.91 mg/dL — ABNORMAL HIGH (ref 0.44–1.00)
GFR calc non Af Amer: 23 mL/min — ABNORMAL LOW (ref >60)
GFR, EST AFRICAN AMERICAN: 26 mL/min — AB (ref >60)
GLUCOSE: 110 mg/dL — AB (ref 65–99)
POTASSIUM: 3.9 mmol/L (ref 3.5–5.1)
Sodium: 134 mmol/L — ABNORMAL LOW (ref 135–145)

## 2018-03-17 MED ORDER — NYSTATIN 100000 UNIT/ML MT SUSP: 5.0000 mL | Freq: Four times a day (QID) | OROMUCOSAL | 0 refills | Status: AC

## 2018-03-17 MED ORDER — DRONABINOL 2.5 MG PO CAPS: 2.5000 mg | ORAL_CAPSULE | Freq: Two times a day (BID) | ORAL | 0 refills | Status: AC

## 2018-03-17 MED ORDER — SODIUM CHLORIDE 0.9% FLUSH: 3.0000 mL | INTRAVENOUS | Status: DC | PRN

## 2018-03-17 MED ORDER — LIDOCAINE VISCOUS HCL 2 % MT SOLN: 15.0000 mL | Freq: Four times a day (QID) | OROMUCOSAL | 0 refills | Status: AC | PRN

## 2018-03-17 MED ORDER — SODIUM CHLORIDE 0.9% FLUSH
3.0000 mL | Freq: Two times a day (BID) | INTRAVENOUS | Status: DC
  Administered 2018-03-17: 08:00:00 3 mL via INTRAVENOUS

## 2018-03-17 MED ORDER — DRONABINOL 2.5 MG PO CAPS
2.5000 mg | ORAL_CAPSULE | Freq: Two times a day (BID) | ORAL | Status: DC
  Administered 2018-03-17: 12:00:00 2.5 mg via ORAL
  Filled 2018-03-17: qty 1

## 2018-03-17 MED ORDER — DIPHENOXYLATE-ATROPINE 2.5-0.025 MG PO TABS: 1.0000 | ORAL_TABLET | Freq: Four times a day (QID) | ORAL | 0 refills | Status: AC | PRN

## 2018-03-17 NOTE — Progress Notes (Signed)
[facility]  [phone] [phone]  ID: EARTHA VONBEHREN OB: 03/22/1931  MR#: [id number]  LYY#:[id number]  Patient Care Team: Ronnell Freshwater, NP as PCP - General (Family Medicine)  CHIEF COMPLAINT: High-grade lymphoma, possible T-cell.  INTERVAL HISTORY: Patient be discharged home today with home health.  Her performance status is still decreased.  She has a minimal appetite.  Her diarrhea has improved.  She is lethargic but offers no further complaints.  REVIEW OF SYSTEMS:   Review of Systems  Constitutional: Positive for malaise/fatigue and weight loss. Negative for fever.  Respiratory: Negative.  Negative for cough and shortness of breath.   Cardiovascular: Positive for leg swelling. Negative for chest pain.  Gastrointestinal: Positive for diarrhea. Negative for abdominal pain, blood in stool, melena, nausea and vomiting.  Genitourinary: Negative.  Negative for dysuria.  Musculoskeletal: Negative.  Negative for back pain.  Skin: Negative.  Negative for rash.  Neurological: Positive for weakness. Negative for focal weakness.  Psychiatric/Behavioral: Negative.  The patient is not nervous/anxious.     As per HPI. Otherwise, a complete review of systems is negative.  PAST MEDICAL HISTORY: Past Medical History:  Diagnosis Date  . Allergy    Environmental  . GERD (gastroesophageal reflux disease)     PAST SURGICAL HISTORY: Past Surgical History:  Procedure Laterality Date  . ABDOMINAL HYSTERECTOMY    . APPENDECTOMY  1946  . Cataract Surgery    . ESOPHAGEAL DILATION    . TONSILLECTOMY    . TONSILLECTOMY Bilateral     FAMILY HISTORY: Family History  Problem Relation Age of Onset  . Stroke Father     ADVANCED DIRECTIVES (Y/N):  @ADVDIR @  HEALTH MAINTENANCE: Social History   Tobacco Use  . Smoking status: Never Smoker  . Smokeless tobacco: Never Used  Substance Use Topics  . Alcohol use: No  . Drug use: No      Colonoscopy:  PAP:  Bone density:  Lipid panel:  Allergies  Allergen Reactions  . Influenza Vaccine Live Other (See Comments)    Was told to never take again because of severe illness  . Codeine Rash    Current Facility-Administered Medications  Medication Dose Route Frequency Provider Last Rate Last Dose  . acetaminophen (TYLENOL) tablet 650 mg  650 mg Oral Q6H PRN Arta Silence, MD       Or  . acetaminophen (TYLENOL) suppository 650 mg  650 mg Rectal Q6H PRN Arta Silence, MD      . diphenoxylate-atropine (LOMOTIL) 2.5-0.025 MG per tablet 1 tablet  1 tablet Oral QID PRN Henreitta Leber, MD   1 tablet at 03/17/18 1000  . dronabinol (MARINOL) capsule 2.5 mg  2.5 mg Oral BID AC Henreitta Leber, MD   2.5 mg at 03/17/18 1154  . enoxaparin (LOVENOX) injection 30 mg  30 mg Subcutaneous Q24H Henreitta Leber, MD   30 mg at 03/17/18 0744  . feeding supplement (ENSURE ENLIVE) (ENSURE ENLIVE) liquid 237 mL  237 mL Oral TID BM Fritzi Mandes, MD   237 mL at 03/17/18 1346  . lidocaine (XYLOCAINE) 2 % viscous mouth solution 15 mL  15 mL Mouth/Throat Q6H PRN Fritzi Mandes, MD   15 mL at 03/15/18 1053  . MEDLINE mouth rinse  15 mL Mouth Rinse BID Henreitta Leber, MD   15 mL at 03/17/18 0747  . menthol-cetylpyridinium (CEPACOL) lozenge 3 mg  1 lozenge Oral PRN Fritzi Mandes, MD   3 mg at 03/15/18 0612  .  oxyCODONE (Oxy IR/ROXICODONE) immediate release tablet 5 mg  5 mg Oral Q4H PRN Arta Silence, MD       Or  . morphine 2 MG/ML injection 2 mg  2 mg Intravenous Q3H PRN Arta Silence, MD      . oxyCODONE (Oxy IR/ROXICODONE) immediate release tablet 10 mg  10 mg Oral Q4H PRN Arta Silence, MD       Or  . morphine 4 MG/ML injection 4 mg  4 mg Intravenous Q3H PRN Arta Silence, MD      . multivitamin with minerals tablet 1 tablet  1 tablet Oral Daily Fritzi Mandes, MD   1 tablet at 03/17/18 0743  . nystatin (MYCOSTATIN) 100000 UNIT/ML suspension 500,000 Units  5 mL  Mouth/Throat QID Fritzi Mandes, MD   500,000 Units at 03/17/18 1346  . ondansetron (ZOFRAN) tablet 4 mg  4 mg Oral Q6H PRN Arta Silence, MD       Or  . ondansetron (ZOFRAN) injection 4 mg  4 mg Intravenous Q6H PRN Arta Silence, MD      . pantoprazole (PROTONIX) EC tablet 40 mg  40 mg Oral Daily Arta Silence, MD   40 mg at 03/17/18 0742  . sodium chloride flush (NS) 0.9 % injection 3 mL  3 mL Intravenous PRN Henreitta Leber, MD      . sodium chloride flush (NS) 0.9 % injection 3 mL  3 mL Intravenous Q12H Henreitta Leber, MD   3 mL at 03/17/18 0818  . thiamine (VITAMIN B-1) tablet 100 mg  100 mg Oral Daily Fritzi Mandes, MD   100 mg at 03/17/18 2774   Current Outpatient Medications  Medication Sig Dispense Refill  . Calcium Carb-Cholecalciferol (CALTRATE 600+D3 SOFT PO) Take 1 tablet by mouth daily.    . cetirizine (ZYRTEC) 10 MG tablet Take 10 mg by mouth daily.    Marland Kitchen esomeprazole (NEXIUM) 40 MG capsule Take 40 mg by mouth daily at 12 noon.    . potassium chloride (K-DUR) 10 MEQ tablet Take 10 mEq by mouth daily.    . diphenoxylate-atropine (LOMOTIL) 2.5-0.025 MG tablet Take 1 tablet by mouth 4 (four) times daily as needed for diarrhea or loose stools. 30 tablet 0  . dronabinol (MARINOL) 2.5 MG capsule Take 1 capsule (2.5 mg total) by mouth 2 (two) times daily before lunch and supper. 60 capsule 0  . lidocaine (XYLOCAINE) 2 % solution Use as directed 15 mLs in the mouth or throat every 6 (six) hours as needed for up to 14 days for mouth pain. 100 mL 0  . nystatin (MYCOSTATIN) 100000 UNIT/ML suspension Use as directed 5 mLs (500,000 Units total) in the mouth or throat 4 (four) times daily for 5 days. 60 mL 0    OBJECTIVE: Vitals:   03/17/18 0500 03/17/18 1216  BP: 98/60 93/69  Pulse: 97 95  Resp: 18 20  Temp: 98.4 F (36.9 C) 99.1 F (37.3 C)  SpO2:  92%     Body mass index is 24.27 kg/m.    ECOG FS:3 - Symptomatic, >50% confined to bed  General: Thin, no acute  distress. Eyes: Pink conjunctiva, anicteric sclera. Lungs: Clear to auscultation bilaterally. Heart: Regular rate and rhythm. No rubs, murmurs, or gallops. Abdomen: Soft, nontender, nondistended. No organomegaly noted, normoactive bowel sounds. Musculoskeletal: No edema, cyanosis, or clubbing. Neuro: Lethargic but alert, answering all questions appropriately. Cranial nerves grossly intact. Skin: No rashes or petechiae noted. Psych: Normal affect.  LAB RESULTS:  Lab Results  Component Value Date   NA 134 (L) 03/17/2018   K 3.9 03/17/2018   CL 102 03/17/2018   CO2 22 03/17/2018   GLUCOSE 110 (H) 03/17/2018   BUN 37 (H) 03/17/2018   CREATININE 1.91 (H) 03/17/2018   CALCIUM 7.3 (L) 03/17/2018   PROT 4.4 (L) 03/12/2018   ALBUMIN 2.3 (L) 03/12/2018   AST 64 (H) 03/12/2018   ALT 32 03/12/2018   ALKPHOS 98 03/12/2018   BILITOT 0.7 03/12/2018   GFRNONAA 23 (L) 03/17/2018   GFRAA 26 (L) 03/17/2018    Lab Results  Component Value Date   WBC 3.9 03/12/2018   NEUTROABS 1.7 04/08/2012   HGB 11.9 (L) 03/12/2018   HCT 34.9 (L) 03/12/2018   MCV 90.1 03/12/2018   PLT 276 03/12/2018     STUDIES: Ct Abdomen Pelvis Wo Contrast  Result Date: 03/13/2018 CLINICAL DATA:  84 year old with diarrhea for 4 weeks.  Weight loss. EXAM: CT ABDOMEN AND PELVIS WITHOUT CONTRAST TECHNIQUE: Multidetector CT imaging of the abdomen and pelvis was performed following the standard protocol without IV contrast. COMPARISON:  None. FINDINGS: Lower chest: No pleural fluid or focal consolidation. No basilar pulmonary nodule. Hepatobiliary: Lack of IV contrast limits assessment for focal lesion. Allowing for this, no focal hepatic lesion is visualized. Gallbladder is significantly distended without calcified gallstone. Common bile duct is normal in caliber measuring 7 mm. Pancreas: No ductal dilatation or inflammation. No obvious pancreatic mass on noncontrast exam. Spleen: Normal in size.  No evidence of focal  abnormality. Adrenals/Urinary Tract: No adrenal nodules. Right adrenal calcification may be sequela of prior hemorrhage. Bilateral renal parenchymal thinning. Prominence of bilateral renal collecting systems, left greater than right. No obstructing stone. Urinary bladder is partially distended. No obvious renal mass on noncontrast exam. Stomach/Bowel: No gastric wall thickening. No bowel obstruction. Enteric contrast is seen throughout the entire colon. Presence of intra-abdominal ascites limits assessment for bowel wall thickening. Allowing for this, no gross bowel wall thickening is seen. Appendix not visualized, patient with reported history of appendectomy. Vascular/Lymphatic: Prominent mesenteric nodes measuring up to 10 mm short axis. Mesenteric mass in the small bowel mesentery of the left abdomen measures 3.7 x 2.7 cm. Additional smaller mesenteric nodes are visualized. Small retroperitoneal nodes not enlarged by size criteria. Mild aortic atherosclerosis without aneurysm. Reproductive: Cystic structure in the left adnexa measures approximately 5 x 3.9 cm, questionable cystic structure in the right adnexa. Post hysterectomy. Other: Moderate volume abdominopelvic ascites. Diffuse mesenteric edema and small volume mesenteric ascites. There is fat stranding and soft tissue density in the anterior omentum primarily the lower abdomen and pelvis suspicious for omental caking. Mild body wall edema. No free air. Musculoskeletal: Scattered small sclerotic densities in the lumbar spine and pelvis are nonspecific but likely bone islands. No destructive lytic lesion. No acute osseous abnormality. IMPRESSION: 1. Findings suspicious for intra-abdominal malignancy with mesenteric mass in the small bowel mesentery, prominent mesenteric nodes, and soft tissue density in the anterior omentum concerning for omental caking. Moderate volume abdominopelvic ascites. Primary malignancy considerations include ovarian with cystic  lesion in the left adnexa measuring 5 x 3.9 cm. Carcinoid is also considered given reported history of diarrhea and mesenteric mass. Occult GI malignancy is also considered. Ascites sampling may be the least invasive target for sampling. Pelvic ultrasound could be considered for evaluation of cystic pelvic mass, however may be technically challenging. 2. Markedly distended gallbladder without biliary dilatation or calcified gallstone. 3. Prominence of bilateral renal collecting systems without frank hydronephrosis  or urolithiasis. 4.  Aortic Atherosclerosis (ICD10-I70.0). Electronically Signed   By: Jeb Levering M.D.   On: 03/13/2018 03:30   Dg Chest 2 View  Result Date: 03/13/2018 CLINICAL DATA:  Lower leg edema EXAM: CHEST - 2 VIEW COMPARISON:  11/11/2015 FINDINGS: The heart size and mediastinal contours are within normal limits. Mild aortic atherosclerosis at the arch. No aneurysm identified. Both lungs are clear. No pulmonary vascular redistribution or CHF. Osteopenic appearance of the dorsal spine. IMPRESSION: No active cardiopulmonary disease. Electronically Signed   By: Ashley Royalty M.D.   On: 03/13/2018 02:01   US Venous Img Lower Bilateral  Result Date: 03/14/2018 CLINICAL DATA:  Bilateral lower extremity edema. EXAM: BILATERAL LOWER EXTREMITY VENOUS DOPPLER ULTRASOUND TECHNIQUE: Gish-scale sonography with graded compression, as well as color Doppler and duplex ultrasound were performed to evaluate the lower extremity deep venous systems from the level of the common femoral vein and including the common femoral, femoral, profunda femoral, popliteal and calf veins including the posterior tibial, peroneal and gastrocnemius veins when visible. The superficial great saphenous vein was also interrogated. Spectral Doppler was utilized to evaluate flow at rest and with distal augmentation maneuvers in the common femoral, femoral and popliteal veins. COMPARISON:  None. FINDINGS: RIGHT LOWER EXTREMITY  Common Femoral Vein: No evidence of thrombus. Normal compressibility, respiratory phasicity and response to augmentation. Saphenofemoral Junction: No evidence of thrombus. Normal compressibility and flow on color Doppler imaging. Profunda Femoral Vein: No evidence of thrombus. Normal compressibility and flow on color Doppler imaging. Femoral Vein: No evidence of thrombus. Normal compressibility, respiratory phasicity and response to augmentation. Popliteal Vein: No evidence of thrombus. Normal compressibility, respiratory phasicity and response to augmentation. Calf Veins: No evidence of thrombus. Normal compressibility and flow on color Doppler imaging. Superficial Great Saphenous Vein: No evidence of thrombus. Normal compressibility. Venous Reflux:  None. Other Findings: No evidence of superficial thrombophlebitis or abnormal fluid collection. LEFT LOWER EXTREMITY Common Femoral Vein: No evidence of thrombus. Normal compressibility, respiratory phasicity and response to augmentation. Saphenofemoral Junction: No evidence of thrombus. Normal compressibility and flow on color Doppler imaging. Profunda Femoral Vein: No evidence of thrombus. Normal compressibility and flow on color Doppler imaging. Femoral Vein: No evidence of thrombus. Normal compressibility, respiratory phasicity and response to augmentation. Popliteal Vein: No evidence of thrombus. Normal compressibility, respiratory phasicity and response to augmentation. Calf Veins: No evidence of thrombus. Normal compressibility and flow on color Doppler imaging. Superficial Great Saphenous Vein: No evidence of thrombus. Normal compressibility. Venous Reflux:  None. Other Findings: No evidence of superficial thrombophlebitis or abnormal fluid collection. IMPRESSION: No evidence of bilateral lower extremity deep venous thrombosis. Electronically Signed   By: Aletta Edouard M.D.   On: 03/14/2018 12:43   US Paracentesis  Result Date: 03/14/2018 INDICATION:  Ascites, abdominal distension EXAM: ULTRASOUND GUIDED RIGHT PARACENTESIS MEDICATIONS: 1% LIDOCAINE LOCAL COMPLICATIONS: None immediate. PROCEDURE: Informed written consent was obtained from the patient after a discussion of the risks, benefits and alternatives to treatment. A timeout was performed prior to the initiation of the procedure. Initial ultrasound scanning demonstrates a large amount of ascites within the right UPPER abdominal quadrant. The right lower abdomen was prepped and draped in the usual sterile fashion. 1% lidocaine with epinephrine was used for local anesthesia. Following this, a 6 Fr Safe-T-Centesis catheter was introduced. An ultrasound image was saved for documentation purposes. The paracentesis was performed. The catheter was removed and a dressing was applied. The patient tolerated the procedure well without immediate post procedural complication.  FINDINGS: A total of approximately 1 L of serosanguineous peritoneal fluid was removed. Samples were sent to the laboratory as requested by the clinical team. IMPRESSION: Successful ultrasound-guided paracentesis yielding 1.0 liters of peritoneal fluid. Electronically Signed   By: Jerilynn Mages.  Shick M.D.   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  PLAN:    1. 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  Final pathology pending, but this appears to be a high-grade T-cell lymphoma.  Given patient's advanced age and declining performance status, treatment may be difficult, but patient will return to the cancer center on Monday or Tuesday next week to discuss her final pathology results and treatment planning if desired. 2.  Diarrhea: Improved but not resolved.  Continue symptomatic treatment. 3.  Renal insufficiency: Patient's creatinine slightly worse today.  We will recheck laboratory work at clinic appointment as above.  Appreciate consult.  Lloyd Huger, MD   03/17/2018 4:40  PM

## 2018-03-17 NOTE — Progress Notes (Signed)
A&O. Up with one assist. Generalized weakness. Family at side.  No complaints during the night.  Resp even and unlabored on RA.  Possible discharge today, as diarrhea has gotten better.

## 2018-03-17 NOTE — Progress Notes (Signed)
Discharged pt home with [place] accompanied by her family. Pt transported in transport chair to private vehicle.

## 2018-03-17 NOTE — Discharge Summary (Signed)
[facility] NAME: Jill Wilson    MR#:  [id number]  DATE OF BIRTH:  1930/11/20  DATE OF ADMISSION:  03/13/2018 ADMITTING PHYSICIAN: Arta Silence, MD  DATE OF DISCHARGE: 03/17/2018  PRIMARY CARE PHYSICIAN: Ronnell Freshwater, NP    ADMISSION DIAGNOSIS:  Dehydration [E86.0] Weakness [R53.1] Renal insufficiency [N28.9] GI malignancy ([facility] [place]) [C26.9] Diarrhea, unspecified type [R19.7]  DISCHARGE DIAGNOSIS:  Active Problems:   Diarrhea   Protein-calorie malnutrition, severe   SECONDARY DIAGNOSIS:   Past Medical History:  Diagnosis Date  . Allergy    Environmental  . GERD (gastroesophageal reflux disease)     HOSPITAL COURSE:   JeanGrayis a84 y.o.femalewith a known history of GERD who p/w 4wk Hx diarrhea, weight loss, decreased appetite/decreased PO intake, leg edema.  1. 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 - Discussed with oncology and plan for outpatient PET scan and biopsy when amenable. -Patient's chromogranin doubles were elevated therefore there was some concern for underlying carcinoid and therefore patient was started on octreotide subcutaneously for her diarrhea.  Patient's diarrhea has subsided and patient has been taken off the octreotide and she will continue some Lomotil as needed for it.  Patient stool studies including C. difficile has been negative. -She did have some contraction alkalosis from the diarrhea which has resolved with bicarbonate infusion.  2. weight loss with hyponatremia, hypoalbuminnemia secondary to poor PO intake - pt. Was placed on Ensure and will cont. That.  She was started on some Marinol and is being discharged on it.   3. Acute renal failure with acidosis secondary dehydration  from G.I. Losses - pt. Was started on odium bicarbonate infusion and her renal function and acidosis has resolved.  Her carbonate is up to 22 upon discharge.  4. Sore throat - likely  Due to thrush. Rapid Strep was (-) -Patient is being discharged on viscous lidocaine and nystatin. -She denies any dysphasia or diet aphasia but does not have an appetite secondary to underlying comorbidities and malignancy.  She is being discharged home with home health services with close follow-up with oncology as an outpatient.  She will follow-up with oncology next week for outpatient PET scan and biopsy before initiating treatment.  DISCHARGE CONDITIONS:   Stable  CONSULTS OBTAINED:  Treatment Team:  Arta Silence, MD Lloyd Huger, MD  DRUG ALLERGIES:   Allergies  Allergen Reactions  . Influenza Vaccine Live Other (See Comments)    Was told to never take again because of severe illness  . Codeine Rash    DISCHARGE MEDICATIONS:   Allergies as of 03/17/2018      Reactions   Influenza Vaccine Live Other (See Comments)   Was told to never take again because of severe illness   Codeine Rash      Medication List    STOP taking these medications   levofloxacin 500 MG tablet Commonly known as:  LEVAQUIN     TAKE these medications   CALTRATE 600+D3 SOFT PO Take 1 tablet by mouth daily.   cetirizine 10 MG tablet Commonly known as:  ZYRTEC Take 10 mg by mouth daily.   diphenoxylate-atropine 2.5-0.025 MG tablet Commonly known as:  LOMOTIL Take 1 tablet by mouth 4 (four) times daily as needed for diarrhea or loose stools.  dronabinol 2.5 MG capsule Commonly known as:  MARINOL Take 1 capsule (2.5 mg total) by mouth 2 (two) times daily before lunch and supper.   esomeprazole 40 MG capsule Commonly known as:  NEXIUM Take 40 mg by mouth daily at 12 noon.   lidocaine 2 % solution Commonly known as:  XYLOCAINE Use as directed 15 mLs in the mouth or throat every 6 (six)  hours as needed for up to 14 days for mouth pain.   nystatin 100000 UNIT/ML suspension Commonly known as:  MYCOSTATIN Use as directed 5 mLs (500,000 Units total) in the mouth or throat 4 (four) times daily for 5 days.   potassium chloride 10 MEQ tablet Commonly known as:  K-DUR Take 10 mEq by mouth daily.            Durable Medical Equipment  (From admission, onward)        Start     Ordered   03/16/18 1400  For home use only DME Walker rolling  Once    Question:  Patient needs a walker to treat with the following condition  Answer:  Weakness   03/16/18 1402        DISCHARGE INSTRUCTIONS:   DIET:  Regular diet  DISCHARGE CONDITION:  Stable  ACTIVITY:  Activity as tolerated  OXYGEN:  Home Oxygen: No.   Oxygen Delivery: room air  DISCHARGE LOCATION:  Home with [facility], RN   If you experience worsening of your admission symptoms, develop shortness of breath, life threatening emergency, suicidal or homicidal thoughts you must seek medical attention immediately by calling 911 or calling your MD immediately  if symptoms less severe.  You Must read complete instructions/literature along with all the possible adverse reactions/side effects for all the Medicines you take and that have been prescribed to you. Take any new Medicines after you have completely understood and accpet all the possible adverse reactions/side effects.   Please note  You were cared for by a hospitalist during your hospital stay. If you have any questions about your discharge medications or the care you received while you were in the hospital after you are discharged, you can call the unit and asked to speak with the hospitalist on call if the hospitalist that took care of you is not available. Once you are discharged, your primary care physician will handle any further medical issues. Please note that NO REFILLS for any discharge medications will be authorized once you are discharged, as it  is imperative that you return to your primary care physician (or establish a relationship with a primary care physician if you do not have one) for your aftercare needs so that they can reassess your need for medications and monitor your lab values.     Today   Still feels somewhat weak and deconditioned.  Appetite is poor.  Pain in her mouth and sore throat has improved.  No other acute events overnight.  VITAL SIGNS:  Blood pressure 98/60, pulse 97, temperature 98.4 F (36.9 C), temperature source Oral, resp. rate 18, height 5\' 6"  (1.676 m), weight 68.2 kg (150 lb 5.7 oz), SpO2 92 %.  I/O:    Intake/Output Summary (Last 24 hours) at 03/17/2018 1150 Last data filed at 03/16/2018 1700 Gross per 24 hour  Intake 480 ml  Output -  Net 480 ml    PHYSICAL EXAMINATION:   GENERAL:  84 y.o.-year-old patient lying in the bed with no acute distress. Thin cachectic EYES: Pupils equal, round, reactive  to light and accommodation. No scleral icterus. Extraocular muscles intact.  HEENT: Head atraumatic, normocephalic. Oropharynx and nasopharynx clear. + Oral Thrush NECK:  Supple, no jugular venous distention. No thyroid enlargement, no tenderness.  LUNGS: Normal breath sounds bilaterally, no wheezing, rales, rhonchi. No use of accessory muscles of respiration.  CARDIOVASCULAR: S1, S2 normal. No murmurs, rubs, or gallops.  ABDOMEN: Soft, nontender, Slightly distended. Bowel sounds present. No organomegaly or mass.  EXTREMITIES: No cyanosis, clubbing or edema b/l.    NEUROLOGIC: Cranial nerves II through XII are intact. No focal Motor or sensory deficits b/l.  Globally weak.  PSYCHIATRIC:  patient is alert and oriented x 3.  SKIN: No obvious rash, lesion, or ulcer.    DATA REVIEW:   CBC Recent Labs  Lab 03/12/18 2238  WBC 3.9  HGB 11.9*  HCT 34.9*  PLT 276    Chemistries  Recent Labs  Lab 03/12/18 2238  03/16/18 0424 03/17/18 0506  NA 131*   < > 135 134*  K 4.3   < > 3.8 3.9   CL 102   < > 103 102  CO2 20*   < > 25 22  GLUCOSE 106*   < > 91 110*  BUN 31*   < > 26* 37*  CREATININE 1.80*   < > 1.34* 1.91*  CALCIUM 7.7*   < > 7.1* 7.3*  MG  --    < > 1.8  --   AST 64*  --   --   --   ALT 32  --   --   --   ALKPHOS 98  --   --   --   BILITOT 0.7  --   --   --    < > = values in this interval not displayed.    Cardiac Enzymes Recent Labs  Lab 03/13/18 0041  TROPONINI <0.03    Microbiology Results  Results for orders placed or performed during the hospital encounter of 03/13/18  C difficile quick scan w PCR reflex     Status: None   Collection Time: 03/13/18 12:41 AM  Result Value Ref Range Status   C Diff antigen NEGATIVE NEGATIVE Final   C Diff toxin NEGATIVE NEGATIVE Final   C Diff interpretation No C. difficile detected.  Final    Comment: Performed at [facility], [address] [place], [place] [address]  Gastrointestinal Panel by PCR , Stool     Status: None   Collection Time: 03/13/18 12:41 AM  Result Value Ref Range Status   Campylobacter species NOT DETECTED NOT DETECTED Final   Plesimonas shigelloides NOT DETECTED NOT DETECTED Final   Salmonella species NOT DETECTED NOT DETECTED Final   Yersinia enterocolitica NOT DETECTED NOT DETECTED Final   Vibrio species NOT DETECTED NOT DETECTED Final   Vibrio cholerae NOT DETECTED NOT DETECTED Final   Enteroaggregative E coli (EAEC) NOT DETECTED NOT DETECTED Final   Enteropathogenic E coli (EPEC) NOT DETECTED NOT DETECTED Final   Enterotoxigenic E coli (ETEC) NOT DETECTED NOT DETECTED Final   Shiga like toxin producing E coli (STEC) NOT DETECTED NOT DETECTED Final   Shigella/Enteroinvasive E coli (EIEC) NOT DETECTED NOT DETECTED Final   Cryptosporidium NOT DETECTED NOT DETECTED Final   Cyclospora cayetanensis NOT DETECTED NOT DETECTED Final   Entamoeba histolytica NOT DETECTED NOT DETECTED Final   Giardia lamblia NOT DETECTED NOT DETECTED Final   Adenovirus F40/41 NOT DETECTED NOT  DETECTED Final   Astrovirus NOT DETECTED NOT DETECTED  Final   Norovirus GI/GII NOT DETECTED NOT DETECTED Final   Rotavirus A NOT DETECTED NOT DETECTED Final   Sapovirus (I, II, IV, and V) NOT DETECTED NOT DETECTED Final    Comment: Performed at [facility], [address] [place], [place] [address]  Group A Strep by PCR     Status: None   Collection Time: 03/15/18  9:25 AM  Result Value Ref Range Status   Group A Strep by PCR NOT DETECTED NOT DETECTED Final    Comment: Performed at [facility], [address], [place], [place] [address]    RADIOLOGY:  No results found.    Management plans discussed with the patient, family and they are in agreement.  CODE STATUS:     Code Status Orders  (From admission, onward)        Start     Ordered   03/13/18 0545  Full code  Continuous     03/13/18 0545      TOTAL TIME TAKING CARE OF THIS PATIENT: 40 minutes.    Henreitta Leber M.D on 03/17/2018 at 11:50 AM  Between 7am to 6pm - Pager - [phone]  After 6pm go to [email or web address] - [employer] [facility]  Office  [phone]  CC: Primary care physician; Ronnell Freshwater, NP

## 2018-03-17 NOTE — Progress Notes (Signed)
Pt reports feeling generalized weakness and "no appetite". Denies pain. Closes eyes during conversation. Family at bedside active in pt care. Marinol started with pt tolerating well. Ate slightly more lunch than breakfast and took part of 1 ensure with ice chips. One stool primarily muscus with lomotil given with no further stools. Pt and family planning on discharge home after consult with Dr. Maryjane Hurter.

## 2018-03-17 NOTE — Care Management (Signed)
Patient to be discharged home with HHPT services. Services set up with [facility]. Malachy Mood contacted and received referral. Walker in room provided by [facility], no other DME orders. RNCM signed off. Ines Bloomer RN BSN RNCM [phone]

## 2018-03-17 NOTE — Progress Notes (Addendum)
progeDr. Maryjane Hurter in to see pt and family with pt and family ready for discharge home with [facility]. Oral and written AVS instructions done with pt and family with stated understanding. Prescriptions x 4 given to family.

## 2018-03-18 LAB — 5 HIAA, QUANTITATIVE, URINE, 24 HOUR
5-HIAA, Ur: 6.5 mg/L
5-HIAA,QUANT.,24 HR URINE: 2.3 mg/(24.h) (ref 0.0–14.9)
TOTAL VOLUME: 350

## 2018-03-19 ENCOUNTER — Inpatient Hospital Stay: Payer: Medicare Other

## 2018-03-19 ENCOUNTER — Emergency Department: Payer: Medicare Other

## 2018-03-19 ENCOUNTER — Other Ambulatory Visit: Payer: Self-pay

## 2018-03-19 ENCOUNTER — Inpatient Hospital Stay
Admission: EM | Admit: 2018-03-19 | Discharge: 2018-04-02 | DRG: 919 | Disposition: E | Payer: Medicare Other | Attending: Internal Medicine | Admitting: Internal Medicine

## 2018-03-19 DIAGNOSIS — Z887 Allergy status to serum and vaccine status: Secondary | ICD-10-CM

## 2018-03-19 DIAGNOSIS — K631 Perforation of intestine (nontraumatic): Secondary | ICD-10-CM | POA: Diagnosis present

## 2018-03-19 DIAGNOSIS — Z6824 Body mass index (BMI) 24.0-24.9, adult: Secondary | ICD-10-CM | POA: Diagnosis not present

## 2018-03-19 DIAGNOSIS — K7290 Hepatic failure, unspecified without coma: Secondary | ICD-10-CM | POA: Diagnosis present

## 2018-03-19 DIAGNOSIS — T17908A Unspecified foreign body in respiratory tract, part unspecified causing other injury, initial encounter: Secondary | ICD-10-CM

## 2018-03-19 DIAGNOSIS — R188 Other ascites: Secondary | ICD-10-CM | POA: Diagnosis present

## 2018-03-19 DIAGNOSIS — R100 Acute abdomen: Secondary | ICD-10-CM | POA: Diagnosis not present

## 2018-03-19 DIAGNOSIS — E872 Acidosis: Secondary | ICD-10-CM | POA: Diagnosis present

## 2018-03-19 DIAGNOSIS — K56609 Unspecified intestinal obstruction, unspecified as to partial versus complete obstruction: Secondary | ICD-10-CM | POA: Diagnosis not present

## 2018-03-19 DIAGNOSIS — Z885 Allergy status to narcotic agent status: Secondary | ICD-10-CM

## 2018-03-19 DIAGNOSIS — G9349 Other encephalopathy: Secondary | ICD-10-CM | POA: Diagnosis present

## 2018-03-19 DIAGNOSIS — E43 Unspecified severe protein-calorie malnutrition: Secondary | ICD-10-CM | POA: Diagnosis present

## 2018-03-19 DIAGNOSIS — Z79899 Other long term (current) drug therapy: Secondary | ICD-10-CM

## 2018-03-19 DIAGNOSIS — C8599 Non-Hodgkin lymphoma, unspecified, extranodal and solid organ sites: Secondary | ICD-10-CM | POA: Diagnosis present

## 2018-03-19 DIAGNOSIS — M6281 Muscle weakness (generalized): Secondary | ICD-10-CM | POA: Diagnosis not present

## 2018-03-19 DIAGNOSIS — Z90710 Acquired absence of both cervix and uterus: Secondary | ICD-10-CM | POA: Diagnosis not present

## 2018-03-19 DIAGNOSIS — Z66 Do not resuscitate: Secondary | ICD-10-CM | POA: Diagnosis not present

## 2018-03-19 DIAGNOSIS — C762 Malignant neoplasm of abdomen: Secondary | ICD-10-CM | POA: Diagnosis not present

## 2018-03-19 DIAGNOSIS — T8189XA Other complications of procedures, not elsewhere classified, initial encounter: Principal | ICD-10-CM | POA: Diagnosis present

## 2018-03-19 DIAGNOSIS — Z9049 Acquired absence of other specified parts of digestive tract: Secondary | ICD-10-CM

## 2018-03-19 DIAGNOSIS — K219 Gastro-esophageal reflux disease without esophagitis: Secondary | ICD-10-CM | POA: Diagnosis present

## 2018-03-19 DIAGNOSIS — I959 Hypotension, unspecified: Secondary | ICD-10-CM | POA: Diagnosis present

## 2018-03-19 DIAGNOSIS — N390 Urinary tract infection, site not specified: Secondary | ICD-10-CM

## 2018-03-19 DIAGNOSIS — N3000 Acute cystitis without hematuria: Secondary | ICD-10-CM | POA: Diagnosis present

## 2018-03-19 DIAGNOSIS — R000 Tachycardia, unspecified: Secondary | ICD-10-CM | POA: Diagnosis not present

## 2018-03-19 DIAGNOSIS — J9811 Atelectasis: Secondary | ICD-10-CM | POA: Diagnosis not present

## 2018-03-19 DIAGNOSIS — R109 Unspecified abdominal pain: Secondary | ICD-10-CM

## 2018-03-19 DIAGNOSIS — Y844 Aspiration of fluid as the cause of abnormal reaction of the patient, or of later complication, without mention of misadventure at the time of the procedure: Secondary | ICD-10-CM | POA: Diagnosis present

## 2018-03-19 DIAGNOSIS — R791 Abnormal coagulation profile: Secondary | ICD-10-CM | POA: Diagnosis present

## 2018-03-19 DIAGNOSIS — Z515 Encounter for palliative care: Secondary | ICD-10-CM | POA: Diagnosis present

## 2018-03-19 DIAGNOSIS — R1030 Lower abdominal pain, unspecified: Secondary | ICD-10-CM | POA: Diagnosis not present

## 2018-03-19 DIAGNOSIS — N179 Acute kidney failure, unspecified: Secondary | ICD-10-CM | POA: Diagnosis present

## 2018-03-19 DIAGNOSIS — C8590 Non-Hodgkin lymphoma, unspecified, unspecified site: Secondary | ICD-10-CM | POA: Diagnosis not present

## 2018-03-19 DIAGNOSIS — R0602 Shortness of breath: Secondary | ICD-10-CM

## 2018-03-19 LAB — COMPREHENSIVE METABOLIC PANEL
ALK PHOS: 96 U/L (ref 38–126)
ALT: 34 U/L (ref 14–54)
AST: 110 U/L — ABNORMAL HIGH (ref 15–41)
Albumin: 1.7 g/dL — ABNORMAL LOW (ref 3.5–5.0)
Anion gap: 14 (ref 5–15)
BUN: 54 mg/dL — ABNORMAL HIGH (ref 6–20)
CHLORIDE: 101 mmol/L (ref 101–111)
CO2: 19 mmol/L — ABNORMAL LOW (ref 22–32)
CREATININE: 2.29 mg/dL — AB (ref 0.44–1.00)
Calcium: 7.5 mg/dL — ABNORMAL LOW (ref 8.9–10.3)
GFR calc Af Amer: 21 mL/min — ABNORMAL LOW (ref >60)
GFR, EST NON AFRICAN AMERICAN: 18 mL/min — AB (ref >60)
Glucose, Bld: 110 mg/dL — ABNORMAL HIGH (ref 65–99)
Potassium: 4.8 mmol/L (ref 3.5–5.1)
SODIUM: 134 mmol/L — AB (ref 135–145)
Total Bilirubin: 0.9 mg/dL (ref 0.3–1.2)
Total Protein: 3.9 g/dL — ABNORMAL LOW (ref 6.5–8.1)

## 2018-03-19 LAB — CBC WITH DIFFERENTIAL/PLATELET
Basophils Absolute: 0.1 10*3/uL (ref 0–0.1)
Basophils Relative: 1 %
EOS PCT: 0 %
Eosinophils Absolute: 0 10*3/uL (ref 0–0.7)
HCT: 41.9 % (ref 35.0–47.0)
HEMOGLOBIN: 14.1 g/dL (ref 12.0–16.0)
LYMPHS ABS: 0.5 10*3/uL — AB (ref 1.0–3.6)
LYMPHS PCT: 6 %
MCH: 30.6 pg (ref 26.0–34.0)
MCHC: 33.7 g/dL (ref 32.0–36.0)
MCV: 90.8 fL (ref 80.0–100.0)
Monocytes Absolute: 0.3 10*3/uL (ref 0.2–0.9)
Monocytes Relative: 3 %
NEUTROS PCT: 90 %
Neutro Abs: 7.4 10*3/uL — ABNORMAL HIGH (ref 1.4–6.5)
PLATELETS: 351 10*3/uL (ref 150–440)
RBC: 4.61 MIL/uL (ref 3.80–5.20)
RDW: 15.7 % — ABNORMAL HIGH (ref 11.5–14.5)
WBC: 8.3 10*3/uL (ref 3.6–11.0)

## 2018-03-19 LAB — LIPASE, BLOOD: Lipase: 37 U/L (ref 11–51)

## 2018-03-19 LAB — URINALYSIS, COMPLETE (UACMP) WITH MICROSCOPIC
BILIRUBIN URINE: NEGATIVE
GLUCOSE, UA: NEGATIVE mg/dL
HGB URINE DIPSTICK: NEGATIVE
KETONES UR: NEGATIVE mg/dL
LEUKOCYTES UA: NEGATIVE
NITRITE: POSITIVE — AB
PROTEIN: NEGATIVE mg/dL
Specific Gravity, Urine: 1.019 (ref 1.005–1.030)
pH: 5 (ref 5.0–8.0)

## 2018-03-19 LAB — PROTIME-INR
INR: 3.6
INR: 4.31
Prothrombin Time: 35.6 seconds — ABNORMAL HIGH (ref 11.4–15.2)
Prothrombin Time: 41 seconds — ABNORMAL HIGH (ref 11.4–15.2)

## 2018-03-19 LAB — APTT: aPTT: 37 seconds — ABNORMAL HIGH (ref 24–36)

## 2018-03-19 MED ORDER — IOPAMIDOL (ISOVUE-300) INJECTION 61%: 15.0000 mL | INTRAVENOUS | Status: DC

## 2018-03-19 MED ORDER — MORPHINE SULFATE (PF) 2 MG/ML IV SOLN
2.0000 mg | INTRAVENOUS | Status: DC | PRN
  Administered 2018-03-19 – 2018-03-20 (×5): 2 mg via INTRAVENOUS
  Filled 2018-03-19 (×5): qty 1

## 2018-03-19 MED ORDER — NYSTATIN 100000 UNIT/ML MT SUSP: 5.0000 mL | Freq: Four times a day (QID) | OROMUCOSAL | Status: DC

## 2018-03-19 MED ORDER — ONDANSETRON HCL 4 MG PO TABS: 4.0000 mg | ORAL_TABLET | Freq: Four times a day (QID) | ORAL | Status: DC | PRN

## 2018-03-19 MED ORDER — SODIUM CHLORIDE 0.9 % IV BOLUS
500.0000 mL | Freq: Once | INTRAVENOUS | Status: AC
Start: 2018-03-19 — End: 2018-03-19
  Administered 2018-03-19: 500 mL via INTRAVENOUS

## 2018-03-19 MED ORDER — DOCUSATE SODIUM 100 MG PO CAPS: 100.0000 mg | ORAL_CAPSULE | Freq: Two times a day (BID) | ORAL | Status: DC

## 2018-03-19 MED ORDER — SODIUM CHLORIDE 0.9 % IV SOLN
INTRAVENOUS | Status: DC
  Administered 2018-03-19: 18:00:00 via INTRAVENOUS

## 2018-03-19 MED ORDER — ONDANSETRON HCL 4 MG/2ML IJ SOLN
4.0000 mg | Freq: Once | INTRAMUSCULAR | Status: AC
  Administered 2018-03-19: 4 mg via INTRAVENOUS
  Filled 2018-03-19: qty 2

## 2018-03-19 MED ORDER — FENTANYL CITRATE (PF) 100 MCG/2ML IJ SOLN
INTRAMUSCULAR | Status: AC
  Filled 2018-03-19: qty 2

## 2018-03-19 MED ORDER — FENTANYL CITRATE (PF) 100 MCG/2ML IJ SOLN
50.0000 ug | Freq: Once | INTRAMUSCULAR | Status: AC
Start: 2018-03-19 — End: 2018-03-19
  Administered 2018-03-19: 50 ug via INTRAVENOUS

## 2018-03-19 MED ORDER — SODIUM CHLORIDE 0.9 % IV SOLN
1.0000 g | INTRAVENOUS | Status: DC
  Administered 2018-03-20: 1 g via INTRAVENOUS
  Filled 2018-03-19: qty 10

## 2018-03-19 MED ORDER — ONDANSETRON HCL 4 MG/2ML IJ SOLN: 4.0000 mg | Freq: Four times a day (QID) | INTRAMUSCULAR | Status: DC | PRN

## 2018-03-19 MED ORDER — SODIUM CHLORIDE 0.9 % IV SOLN
1.0000 g | Freq: Once | INTRAVENOUS | Status: AC
  Administered 2018-03-19: 1 g via INTRAVENOUS
  Filled 2018-03-19: qty 10

## 2018-03-19 MED ORDER — ACETAMINOPHEN 650 MG RE SUPP: 650.0000 mg | Freq: Four times a day (QID) | RECTAL | Status: DC | PRN

## 2018-03-19 MED ORDER — NYSTATIN 100000 UNIT/ML MT SUSP
5.0000 mL | Freq: Four times a day (QID) | OROMUCOSAL | Status: DC
  Filled 2018-03-19: qty 5

## 2018-03-19 MED ORDER — TRAMADOL HCL 50 MG PO TABS: 50.0000 mg | ORAL_TABLET | Freq: Four times a day (QID) | ORAL | Status: DC | PRN

## 2018-03-19 MED ORDER — ACETAMINOPHEN 325 MG PO TABS: 650.0000 mg | ORAL_TABLET | Freq: Four times a day (QID) | ORAL | Status: DC | PRN

## 2018-03-19 MED ORDER — MORPHINE SULFATE (PF) 4 MG/ML IV SOLN: 4.0000 mg | Freq: Once | INTRAVENOUS | Status: DC

## 2018-03-19 MED ORDER — SODIUM CHLORIDE 0.9 % IV SOLN
1000.0000 mL | Freq: Once | INTRAVENOUS | Status: AC
  Administered 2018-03-19: 1000 mL via INTRAVENOUS

## 2018-03-19 NOTE — Progress Notes (Signed)
   03/03/2018 1609  Clinical Encounter Type  Visited With Patient and family together  Visit Type Initial ([facility] and OR: AD)  Referral From Nurse  Consult/Referral To Chaplain  Recommendations  (Speak to patient's MD about DNR.)   [facility] received OR to create or update AD at 2:37. [place] received page at 4:09 from nurse stating family would like to complete an AD. [place] responded and patient was not aware when entering the room.  [place] talked with pt's son explaining about AD, how it is patient driven and the different components.  Mart explained that in order to complete the AD, the pt would need to be awake and cognizant of the decisions she was making.  Son believed she had an AD in the past, but was not sure.  This recent illness was sudden and he is now revisiting her documents. He inquired about DNR and was confused about if the AD included DNR.  [place] informed him that DNR was issued by the pt's doctor and to consult the patient's doctor.

## 2018-03-19 NOTE — Consult Note (Signed)
Surgical Consultation  03/11/2018  Jill [place] is an 84 y.o. female.   Referring Physician: Gouru  CC: Acute abdomen  HPI: I was asked see this patient emergently for an acute abdomen.  She is been admitted to the hospital since this morning where x-rays including a CAT scan done prior to 11:00 this morning showed free air and she has been tachycardic and hypotensive almost all day.  She is acidotic and has renal and liver failure as well.  A family conference held earlier today was held and a decision about DNR status was not forthcoming.  History is obtained from the patient and multiple family members but the patient is somewhat somnolent and speaks very faintly with a very high respiratory rate making it difficult to obtain a history directly from her and review of systems is not possible for the same reason.  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  Patient currently has abdominal pain mostly in the upper abdomen denies nausea or vomiting.  Past Medical History:  Diagnosis Date  . Allergy    Environmental  . GERD (gastroesophageal reflux disease)     Past Surgical History:  Procedure Laterality Date  . ABDOMINAL HYSTERECTOMY    . APPENDECTOMY  1946  . Cataract Surgery    . ESOPHAGEAL DILATION    . TONSILLECTOMY    . TONSILLECTOMY Bilateral     Family History  Problem Relation Age of Onset  . Stroke Father     Social History:  reports that she has never smoked. She has never used smokeless tobacco. She reports that she does not drink alcohol or use drugs.  Allergies:  Allergies  Allergen Reactions  . Influenza Vaccine Live Other (See Comments)    Was told to never take again because of severe illness  .  Codeine Rash    Medications reviewed.   Review of Systems:   Review of Systems  Unable to perform ROS: Acuity of condition     Physical Exam:  BP 104/69 (BP Location: Right Arm)   Pulse (!) 118   Temp 97.9 F (36.6 C) (Oral)   Resp 20   Ht 5' 6" (1.676 m)   Wt 150 lb (68 kg)   SpO2 97%   BMI 24.21 kg/m   Physical Exam  Constitutional: She appears toxic. She appears ill. She appears distressed.  Vital signs demonstrated daylong tachycardia and hypotension relative.  She appears distressed and tachypneic as well  HENT:  Muscle wasting in the facial muscles and forehead muscles.  Eyes: Pupils are equal, round, and reactive to light. EOM are normal.  Pulmonary/Chest:  Tachypneic using accessory respiratory muscles to a small degree  Abdominal: She exhibits distension and ascites. There is generalized tenderness. There is rigidity, rebound and guarding.  Marked rebound and percussion tenderness in the upper abdomen mostly.  Distended and tympanitic  Neurological: She is alert.  Skin: Skin is warm and dry. No erythema.  Vitals reviewed.     Results for orders placed or performed during the hospital encounter of 03/16/2018 (from the past 48 hour(s))  CBC with Differential     Status: Abnormal   Collection Time: 03/08/2018  9:20 AM  Result Value Ref  Range   WBC 8.3 3.6 - 11.0 K/uL   RBC 4.61 3.80 - 5.20 MIL/uL   Hemoglobin 14.1 12.0 - 16.0 g/dL   HCT 41.9 35.0 - 47.0 %   MCV 90.8 80.0 - 100.0 fL   MCH 30.6 26.0 - 34.0 pg   MCHC 33.7 32.0 - 36.0 g/dL   RDW 15.7 (H) 11.5 - 14.5 %   Platelets 351 150 - 440 K/uL   Neutrophils Relative % 90 %   Neutro Abs 7.4 (H) 1.4 - 6.5 K/uL   Lymphocytes Relative 6 %   Lymphs Abs 0.5 (L) 1.0 - 3.6 K/uL   Monocytes Relative 3 %   Monocytes Absolute 0.3 0.2 - 0.9 K/uL   Eosinophils Relative 0 %   Eosinophils Absolute 0.0 0 - 0.7 K/uL   Basophils Relative 1 %   Basophils Absolute 0.1 0 - 0.1 K/uL    Comment: Performed at [facility], [address] [place], [place] [address]  Comprehensive metabolic panel     Status: Abnormal   Collection Time: 03/30/2018  9:20 AM  Result Value Ref Range   Sodium 134 (L) 135 - 145 mmol/L   Potassium 4.8 3.5 - 5.1 mmol/L   Chloride 101 101 - 111 mmol/L   CO2 19 (L) 22 - 32 mmol/L   Glucose, Bld 110 (H) 65 - 99 mg/dL   BUN 54 (H) 6 - 20 mg/dL   Creatinine, Ser 2.29 (H) 0.44 - 1.00 mg/dL   Calcium 7.5 (L) 8.9 - 10.3 mg/dL   Total Protein 3.9 (L) 6.5 - 8.1 g/dL   Albumin 1.7 (L) 3.5 - 5.0 g/dL   AST 110 (H) 15 - 41 U/L   ALT 34 14 - 54 U/L   Alkaline Phosphatase 96 38 - 126 U/L   Total Bilirubin 0.9 0.3 - 1.2 mg/dL   GFR calc non Af Amer 18 (L) >60 mL/min   GFR calc Af Amer 21 (L) >60 mL/min    Comment: (NOTE) The eGFR has been calculated using the CKD EPI equation. This calculation has not been validated in all clinical situations. eGFR's persistently <60 mL/min signify possible Chronic Kidney Disease.    Anion gap 14 5 - 15    Comment: Performed at [facility], [address] [place], [place] [address]  Lipase, blood     Status: None   Collection Time: 03/17/2018  9:20 AM  Result Value Ref Range   Lipase 37 11 - 51 U/L    Comment: Performed at [facility], [address] [place], [place] [address]  Protime-INR     Status: Abnormal   Collection Time: 03/13/2018  9:20 AM  Result Value Ref Range   Prothrombin Time 35.6 (H) 11.4 - 15.2 seconds    Comment: QUANTITY NOT SUFFICIENT, UNABLE TO PERFORM TEST CALLED LORREI LEMONS AT 1022 ON 03/28/2018 BY SNJ, REDRAW    INR 3.60     Comment: QUANTITY NOT SUFFICIENT, UNABLE TO PERFORM TEST CALLED LORREI LEMONS AT 1022 ON 03/22/2018 BY SNJ, REDRAW Performed at [facility], [address] [place],  [address]   Urinalysis, Complete w Microscopic     Status: Abnormal   Collection Time: 03/10/2018  9:36 AM  Result Value Ref Range   Color, Urine AMBER (A) YELLOW    Comment: BIOCHEMICALS  MAY BE AFFECTED BY COLOR   APPearance HAZY (A) CLEAR   Specific Gravity, Urine 1.019 1.005 - 1.030   pH 5.0 5.0 - 8.0  Glucose, UA NEGATIVE NEGATIVE mg/dL   Hgb urine dipstick NEGATIVE NEGATIVE   Bilirubin Urine NEGATIVE NEGATIVE   Ketones, ur NEGATIVE NEGATIVE mg/dL   Protein, ur NEGATIVE NEGATIVE mg/dL   Nitrite POSITIVE (A) NEGATIVE   Leukocytes, UA NEGATIVE NEGATIVE   RBC / HPF 0-5 0 - 5 RBC/hpf   WBC, UA 0-5 0 - 5 WBC/hpf   Bacteria, UA MANY (A) NONE SEEN   Squamous Epithelial / LPF 0-5 0 - 5   Mucus PRESENT    Hyaline Casts, UA PRESENT     Comment: Performed at [facility], [address] [place], [place] [address]  Protime-INR     Status: Abnormal   Collection Time: 03/11/2018 11:34 AM  Result Value Ref Range   Prothrombin Time 41.0 (H) 11.4 - 15.2 seconds   INR 4.31 (HH)     Comment: RESULT REPEATED AND VERIFIED CRITICAL RESULT CALLED TO, READ BACK BY AND VERIFIED WITH: MARY NEEDHAM ON 03/15/2018 AT 1207 QSD Performed at [facility], [address] [place], [place] [address]   APTT     Status: Abnormal   Collection Time: 04/01/2018 11:34 AM  Result Value Ref Range   aPTT 37 (H) 24 - 36 seconds    Comment:        IF BASELINE aPTT IS ELEVATED, SUGGEST PATIENT RISK ASSESSMENT BE USED TO DETERMINE APPROPRIATE ANTICOAGULANT THERAPY. Performed at [facility], [place], [place] [address]    Ct Abdomen Pelvis Wo Contrast  Result Date: 03/20/2018 CLINICAL DATA:  High-grade bowel obstruction EXAM: CT ABDOMEN AND PELVIS WITHOUT CONTRAST TECHNIQUE: Multidetector CT imaging of the abdomen and pelvis was performed following the standard protocol without IV contrast. COMPARISON:  CT 03/13/2018 FINDINGS: Lower chest: Small bilateral pleural effusions with bibasilar atelectasis, new since prior study. Heart is normal size. Hepatobiliary: Markedly distended gallbladder, stable. No visible focal hepatic abnormality. Pancreas: No focal  abnormality or ductal dilatation. Spleen: No focal abnormality.  Normal size. Adrenals/Urinary Tract: Stable right adrenal calcifications. No left adrenal abnormality. No focal renal abnormality. No stones or hydronephrosis. Urinary bladder is unremarkable. Stomach/Bowel: Scattered left colonic diverticula. No active diverticulitis. Vascular/Lymphatic: Extensive mesenteric masses and adenopathy as seen on recent CT. The largest central mesenteric mass measures up to 4 cm compared to 3.7 cm, likely not significantly changed. Aortic atherosclerosis. No aneurysm. Reproductive: Mixed solid and cystic pelvic mass again noted, not as well visualized with lack of intravenous contrast, measuring approximately 5 cm in greatest diameter, unchanged. Prior hysterectomy. Other: Moderate to large volume ascites in the abdomen or pelvis. There is free air noted anteriorly in the abdomen and elsewhere scattered throughout the abdomen and pelvis. This may be related to recent paracentesis. Musculoskeletal: No destructive bone lesion or acute bony abnormality. Scattered sclerotic foci, likely bone islands, stable. IMPRESSION: New pneumoperitoneum, question related to recent paracentesis. Recommend clinical correlation for symptoms of possible bowel perforation. Large volume ascites. Small bilateral pleural effusions with bibasilar atelectasis. Mesenteric and omental masses again noted as well as the central to left pelvic solid and cystic mass, stable since prior study. Significant distention of the gallbladder, stable. Electronically Signed   By: Rolm Baptise M.D.   On: 03/15/2018 11:14   Dg Chest 1 View  Addendum Date: 03/05/2018   ADDENDUM REPORT: 03/14/2018 19:15 ADDENDUM: Findings and suspicion of acute bowel perforation were discussed by telephone with Dr. Nicholes Mango on 03/18/2018 at 1908 hours. Electronically Signed   By: Herminio Heads.D.  On: 03/24/2018 19:15   Result Date: 03/17/2018 CLINICAL DATA:  84 year old female  with abdominal pain. Status post ultrasound-guided paracentesis on 03/14/2018 following CT demonstrating evidence of omental caking earlier this month. Pneumoperitoneum and ascites on CT this morning. EXAM: CHEST  1 VIEW COMPARISON:  CT Abdomen and Pelvis 1056 hours today and earlier. FINDINGS: AP upright view of the chest at 1838 hours. A moderate to large volume of free air beneath the diaphragm appears increased from the CT earlier today. Associated diaphragm elevation. Crowding of lung markings at the lung bases. No pneumothorax, pulmonary edema or consolidation. Small pleural effusions were better demonstrated by CT today. Mediastinal contours remain within normal limits. Visualized tracheal air column is within normal limits. Non-specific visible bowel gas pattern. IMPRESSION: 1. Moderate to large volume of pneumoperitoneum in the abdomen appears increased from the CT earlier today arguing in favor of acute bowel perforation. 2. Low lung volumes with atelectasis. Small pleural effusions better demonstrated by CT earlier today. Electronically Signed: By: Genevie Ann M.D. On: 03/07/2018 19:00   Dg Abd Portable 2v  Result Date: 03/31/2018 CLINICAL DATA:  84 year old female with abdominal pain, abnormal CT Abdomen and Pelvis and radiographs demonstrating pneumoperitoneum today. EXAM: PORTABLE ABDOMEN - 2 VIEW COMPARISON:  Portable chest radiograph 1838 hours today and earlier. FINDINGS: Upright and supine views of the abdomen and pelvis. Moderate to large volume of pneumoperitoneum redemonstrated, including beneath the entire surface of the diaphragm. This once again seems increased from the volume of free air demonstrated by CT at 1056 hours today. No superimposed dilated small or large bowel loop is identified in the abdomen. Vandeberg hazy opacity in the abdomen corresponds to ascites on the CT earlier today. No acute osseous abnormality identified. IMPRESSION: 1. Moderate to large volume of pneumoperitoneum  redemonstrated which seems increased from the CT at 1056 hours today. This was previously discussed with Dr. Nicholes Mango by telephone at 1908 hours today. 2. Superimposed ascites.  No dilated bowel is evident. Electronically Signed   By: Genevie Ann M.D.   On: 03/08/2018 19:46    Assessment/Plan:  CT scan and labs are personally reviewed.  Multiple family members are present which aided in history as well as from the chart as the patient cannot perform a proper interview or answer questions concerning review of systems.  This a patient with an acute abdomen.  I was asked see the patient emergently for an acute abdomen.  CT scan and multiple studies demonstrate free air today and coupled with her vital signs, obvious kidney failure with a creatinine of over 2 and liver failure with an albumin of 1.7 and an INR over 4 all suggestive of severe sequelae of a likely diagnosis of abdominal malignancy possibly lymphoma.  With free air being present this patient would usually under most circumstances be offered exploratory laparotomy.  However with known ascites hypoalbuminemia coagulopathy acidosis and a likely GI malignancy possibly lymphoma I would not recommend surgery under the circumstances.  I discussed with the family the rationale for this I also discussed with him the potential for surgery and this extreme likelihood if not certainty that she would end up on the ventilator in the ICU for the remainder of her short life.  Multiple questions were answered for them.  They had not yet made a decision about DNR status.  Prior to this dictation I made a phone call to the internist who had during this short timeframe excepted a phone call from the family asking for  DNR status.  Therefore at this time there are no surgical plans no surgical indications in this terminally ill patient.  Florene Glen, MD, FACS

## 2018-03-19 NOTE — Progress Notes (Signed)
Chest xray ordered after patient aspirated on nystatin solution to check for aspiration pneumonia. Xray tech contacted nurse with concern for free air seen on the xray, nurse then notified Dr. Margaretmary Eddy of what xray tech stated. Per Dr. Margaretmary Eddy " I will look at the xray now and let me contact surgery". Radiologist that read xray also called and asked for Dr. Rinaldo Ratel contact information this was provided.

## 2018-03-19 NOTE — H&P (Signed)
[facility] [place] NAME: Jill [place]    MR#:  [id number]  DATE OF BIRTH:  [birth year]  DATE OF ADMISSION:  03/11/2018  PRIMARY CARE PHYSICIAN: Ronnell Freshwater, NP   REQUESTING/REFERRING PHYSICIAN: [place]  CHIEF COMPLAINT:   Abdominal pain HISTORY OF PRESENT ILLNESS:  Jill [place]  is a 84 y.o. female with a known history of GERD, recent diagnosis of high-grade lymphoma of the GI tract,  just discharged from the hospital on June 15 with a discharge diagnosis of diarrhea, severe protein energy malnutrition is presenting to the ED with acute abdominal pain.  According to the patient's son and daughter at bedside patient is more altered and having severe abdominal pain discomfort abdomen is very tight, urine looks abnormal and hospitalist team is called to admit the patient.  Patient is very lethargic and unable to get any history from her  PAST MEDICAL HISTORY:   Past Medical History:  Diagnosis Date  . Allergy    Environmental  . GERD (gastroesophageal reflux disease)     PAST SURGICAL HISTOIRY:   Past Surgical History:  Procedure Laterality Date  . ABDOMINAL HYSTERECTOMY    . APPENDECTOMY  1946  . Cataract Surgery    . ESOPHAGEAL DILATION    . TONSILLECTOMY    . TONSILLECTOMY Bilateral     SOCIAL HISTORY:   Social History   Tobacco Use  . Smoking status: Never Smoker  . Smokeless tobacco: Never Used  Substance Use Topics  . Alcohol use: No    FAMILY HISTORY:   Family History  Problem Relation Age of Onset  . Stroke Father     DRUG ALLERGIES:   Allergies  Allergen Reactions  . Influenza Vaccine Live Other (See Comments)    Was told to never take again because of severe illness  . Codeine Rash    REVIEW OF SYSTEMS:  Review of system unobtainable as the patient is altered  MEDICATIONS AT HOME:   Prior to Admission medications   Medication Sig Start Date End Date Taking? Authorizing Provider  Calcium  Carb-Cholecalciferol (CALTRATE 600+D3 SOFT PO) Take 1 tablet by mouth daily.   Yes [provider]  cetirizine (ZYRTEC) 10 MG tablet Take 10 mg by mouth daily.   Yes [provider]  dronabinol (MARINOL) 2.5 MG capsule Take 1 capsule (2.5 mg total) by mouth 2 (two) times daily before lunch and supper. 03/17/18 04/16/18 Yes Sainani, Belia Heman, MD  esomeprazole (NEXIUM) 40 MG capsule Take 40 mg by mouth daily at 12 noon.   Yes [provider]  Multiple Vitamins-Minerals (MULTIVITAMIN ADULT) CHEW Chew 1 tablet by mouth daily.   Yes [provider]  nystatin (MYCOSTATIN) 100000 UNIT/ML suspension Use as directed 5 mLs (500,000 Units total) in the mouth or throat 4 (four) times daily for 5 days. 03/17/18 03/22/18 Yes Sainani, Belia Heman, MD  potassium chloride (K-DUR) 10 MEQ tablet Take 10 mEq by mouth daily.   Yes [provider]  diphenoxylate-atropine (LOMOTIL) 2.5-0.025 MG tablet Take 1 tablet by mouth 4 (four) times daily as needed for diarrhea or loose stools. 03/17/18   Henreitta Leber, MD  lidocaine (XYLOCAINE) 2 % solution Use as directed 15 mLs in the mouth or throat every 6 (six) hours as needed for up to 14 days for mouth pain. Patient not taking: Reported on 03/13/2018 03/17/18 03/31/18  Henreitta Leber, MD      VITAL SIGNS:  Blood pressure 104/69,  pulse (!) 118, temperature 97.9 F (36.6 C), temperature source Oral, resp. rate 20, height 5\' 6"  (1.676 m), weight 68 kg (150 lb), SpO2 97 %.  PHYSICAL EXAMINATION:  GENERAL:  84 y.o.-year-old patient lying in the bed with no acute distress.  EYES: Pupils equal, round, reactive to light and accommodation. No scleral icterus. Extraocular muscles intact.  HEENT: Head atraumatic, normocephalic. Oropharynx and nasopharynx clear.  NECK:  Supple, no jugular venous distention. No thyroid enlargement, no tenderness.  LUNGS: Normal breath sounds bilaterally, no wheezing, rales,rhonchi or crepitation. No use of  accessory muscles of respiration.  CARDIOVASCULAR: S1, S2 normal. No murmurs, rubs, or gallops.  ABDOMEN: Soft, nontender, distended. Bowel sounds present.  EXTREMITIES: No pedal edema, cyanosis, or clubbing.  NEUROLOGIC: Arousable but falling asleep, disoriented  pSYCHIATRIC: The patient is arousable and disoriented SKIN: No obvious rash, lesion, or ulcer.   LABORATORY PANEL:   CBC Recent Labs  Lab 03/16/2018 0920  WBC 8.3  HGB 14.1  HCT 41.9  PLT 351   ------------------------------------------------------------------------------------------------------------------  Chemistries  Recent Labs  Lab 03/16/18 0424  03/07/2018 0920  NA 135   < > 134*  K 3.8   < > 4.8  CL 103   < > 101  CO2 25   < > 19*  GLUCOSE 91   < > 110*  BUN 26*   < > 54*  CREATININE 1.34*   < > 2.29*  CALCIUM 7.1*   < > 7.5*  MG 1.8  --   --   AST  --   --  110*  ALT  --   --  34  ALKPHOS  --   --  96  BILITOT  --   --  0.9   < > = values in this interval not displayed.   ------------------------------------------------------------------------------------------------------------------  Cardiac Enzymes Recent Labs  Lab 03/13/18 0041  TROPONINI <0.03   ------------------------------------------------------------------------------------------------------------------  RADIOLOGY:  Ct Abdomen Pelvis Wo Contrast  Result Date: 03/03/2018 CLINICAL DATA:  High-grade bowel obstruction EXAM: CT ABDOMEN AND PELVIS WITHOUT CONTRAST TECHNIQUE: Multidetector CT imaging of the abdomen and pelvis was performed following the standard protocol without IV contrast. COMPARISON:  CT 03/13/2018 FINDINGS: Lower chest: Small bilateral pleural effusions with bibasilar atelectasis, new since prior study. Heart is normal size. Hepatobiliary: Markedly distended gallbladder, stable. No visible focal hepatic abnormality. Pancreas: No focal abnormality or ductal dilatation. Spleen: No focal abnormality.  Normal size.  Adrenals/Urinary Tract: Stable right adrenal calcifications. No left adrenal abnormality. No focal renal abnormality. No stones or hydronephrosis. Urinary bladder is unremarkable. Stomach/Bowel: Scattered left colonic diverticula. No active diverticulitis. Vascular/Lymphatic: Extensive mesenteric masses and adenopathy as seen on recent CT. The largest central mesenteric mass measures up to 4 cm compared to 3.7 cm, likely not significantly changed. Aortic atherosclerosis. No aneurysm. Reproductive: Mixed solid and cystic pelvic mass again noted, not as well visualized with lack of intravenous contrast, measuring approximately 5 cm in greatest diameter, unchanged. Prior hysterectomy. Other: Moderate to large volume ascites in the abdomen or pelvis. There is free air noted anteriorly in the abdomen and elsewhere scattered throughout the abdomen and pelvis. This may be related to recent paracentesis. Musculoskeletal: No destructive bone lesion or acute bony abnormality. Scattered sclerotic foci, likely bone islands, stable. IMPRESSION: New pneumoperitoneum, question related to recent paracentesis. Recommend clinical correlation for symptoms of possible bowel perforation. Large volume ascites. Small bilateral pleural effusions with bibasilar atelectasis. Mesenteric and omental masses again noted as well as the central to left pelvic  solid and cystic mass, stable since prior study. Significant distention of the gallbladder, stable. Electronically Signed   By: Rolm Baptise M.D.   On: 04/01/2018 11:14    EKG:   Orders placed or performed during the hospital encounter of 03/15/2018  . EKG 12-Lead  . EKG 12-Lead    IMPRESSION AND PLAN:   #Acute encephalopathy from acute cystitis Monitor patient closely Hydrate with IV fluids and get neurochecks  #Acute cystitis Urine culture and sensitivity and IV Rocephin  #Acute abdominal pain from ascites with recent diagnosis of high-grade lymphoma-possible T cell of the  GI tract- ultrasound-guided abdominal paracentesis is ordered Consult placed to Dr. Grayland Ormond She is actually scheduled for outpatient PET scan tomorrow   #Acute kidney injury Avoid nephrotoxins and gentle hydration with IV fluids Creatinine up trended from 1.20 on 613 2019-2.29 today  #Supratherapeutic INR at 4.31 No anticoagulants needed at this time No active bleeding noticed   All the records are reviewed and case discussed with ED provider. Management plans discussed with the patient, family and they are in agreement.  CODE STATUS: fc , son and daughter [place] THIS PATIENT: 43  minutes.   Note: This dictation was prepared with Dragon dictation along with smaller phrase technology. Any transcriptional errors that result from this process are unintentional.  Nicholes Mango M.D on 03/05/2018 at 4:55 PM  Between 7am to 6pm - Pager - [phone]  After 6pm go to [email or web address] - password [email or web address] [facility]  Office  [phone]  CC: Primary care physician; Ronnell Freshwater, NP

## 2018-03-19 NOTE — ED Triage Notes (Signed)
Pt arrives via ems from home with c/o abdominal pain. Ems reports pt seen here over weekend for dehydration, mass found in abdomen thought to be cancer. Pt has since declined experiencing extreme pain and unable to eat or drink anything. Pt seems very lethargic on arrival, but responsive to voice and oriented.

## 2018-03-19 NOTE — ED Notes (Signed)
Ems administered 50 mcg fentanyl, 4 mg zofran, and 371mL NS in route to facility

## 2018-03-19 NOTE — Care Management (Signed)
Jill [place] with [facility] home health aware of patient presentation to hospital.

## 2018-03-19 NOTE — Progress Notes (Signed)
   03/29/2018 2015  Clinical Encounter Type  Visited With Patient and family together  Visit Type Follow-up (duplicate OR: AD)  Referral From Physician  Consult/Referral To Chaplain    [place] received another OR to complete AD.  [place] already educated family earlier this afternoon, but [place] checked in with family just in case.  Family did not need further education.  Family did confirm with [place] that pt's doctor educated them about DNR.

## 2018-03-19 NOTE — Progress Notes (Signed)
Family Meeting Note  Advance Directive:yes  Today a meeting took place with the Patient, son and daughter, granddaughter and other significant family members at bedside  Patient is unable to participate due NZ:VJKQAS capacity Encephalopathy   The following clinical team members were present during this meeting:MD  The following were discussed:Patient's diagnosis: Delirium, acute cystitis, abdominal pain with ascites, acute kidney injury, supratherapeutic INR 4.31, recent diagnosis of high-grade lymphoma of the GI tract, other comorbidities GERD and seasonal allergies, treatment plan of care discussed in detail with the patient son and daughter and other significant family members at bedside.  They all verbalized understanding of the plan    patient's progosis: Unable to determine and Goals for treatment: Full Code, son Jill Wilson and   daughters Jill Wilson are healthcare power of attorney  Additional follow-up to be provided: Hospitalist and oncology  Time spent during discussion:18 min  Nicholes Mango, MD

## 2018-03-19 NOTE — Progress Notes (Signed)
Chest x-ray with free air, CT abdomen with new pneumoperitoneum which is probably from recent paracentesis but possible bowel perforation. Stat call placed to surgery Dr. Burt Knack and updated the situation.  He will see the patient ASAP, patient's INR is at 4.3.  This is discussed with the patient's son and granddaughter Mickel Baas.  They have requested to change the patient's CODE STATUS to DO NOT RESUSCITATE. Condition critical

## 2018-03-19 NOTE — ED Provider Notes (Signed)
[facility] Emergency Department Provider Note   ____________________________________________    I have reviewed the triage vital signs and the nursing notes.   HISTORY  Chief Complaint Abdominal Pain     HPI Jill Wilson is a 84 y.o. female who presents with severe left lower abdominal pain which started last night became progressively worse, she describes the pain as sharp.  Patient recently admitted to the hospital, diagnosed with GI mass with biopsy consistent with lymphoma, had plans for outpatient follow-up with oncology this week.  Family also reports the patient was too weak to walk today but was getting around with a walker well yesterday.  No fevers reported.   Past Medical History:  Diagnosis Date  . Allergy    Environmental  . GERD (gastroesophageal reflux disease)     Patient Active Problem List   Diagnosis Date Noted  . Acute cystitis 03/27/2018  . Diarrhea 03/13/2018  . Protein-calorie malnutrition, severe 03/13/2018  . Acute bronchitis 12/31/2017  . Non-seasonal allergic rhinitis 12/31/2017  . Infective urethritis 09/13/2017  . Essential hypertension, benign 09/13/2017    Past Surgical History:  Procedure Laterality Date  . ABDOMINAL HYSTERECTOMY    . APPENDECTOMY  1946  . Cataract Surgery    . ESOPHAGEAL DILATION    . TONSILLECTOMY    . TONSILLECTOMY Bilateral     Prior to Admission medications   Medication Sig Start Date End Date Taking? Authorizing Provider  Calcium Carb-Cholecalciferol (CALTRATE 600+D3 SOFT PO) Take 1 tablet by mouth daily.   Yes [provider]  cetirizine (ZYRTEC) 10 MG tablet Take 10 mg by mouth daily.   Yes [provider]  dronabinol (MARINOL) 2.5 MG capsule Take 1 capsule (2.5 mg total) by mouth 2 (two) times daily before lunch and supper. 03/17/18 04/16/18 Yes Sainani, Belia Heman, MD  esomeprazole (NEXIUM) 40 MG capsule Take 40 mg by mouth daily at 12 noon.   Yes [provider]  Multiple Vitamins-Minerals (MULTIVITAMIN ADULT) CHEW Chew 1 tablet by mouth daily.   Yes [provider]  nystatin (MYCOSTATIN) 100000 UNIT/ML suspension Use as directed 5 mLs (500,000 Units total) in the mouth or throat 4 (four) times daily for 5 days. 03/17/18 03/22/18 Yes Sainani, Belia Heman, MD  potassium chloride (K-DUR) 10 MEQ tablet Take 10 mEq by mouth daily.   Yes [provider]  diphenoxylate-atropine (LOMOTIL) 2.5-0.025 MG tablet Take 1 tablet by mouth 4 (four) times daily as needed for diarrhea or loose stools. 03/17/18   Henreitta Leber, MD  lidocaine (XYLOCAINE) 2 % solution Use as directed 15 mLs in the mouth or throat every 6 (six) hours as needed for up to 14 days for mouth pain. Patient not taking: Reported on 03/12/2018 03/17/18 03/31/18  Henreitta Leber, MD     Allergies Influenza vaccine live and Codeine  Family History  Problem Relation Age of Onset  . Stroke Father     Social History Social History   Tobacco Use  . Smoking status: Never Smoker  . Smokeless tobacco: Never Used  Substance Use Topics  . Alcohol use: No  . Drug use: No    Review of Systems  Constitutional: No fever/chills Eyes: No visual changes.  ENT: No neck pain Cardiovascular: Denies chest pain. Respiratory: No cough Gastrointestinal: As above Genitourinary: Negative for dysuria. Musculoskeletal: Negative for back pain. Skin: Negative for rash. Neurological: Negative for headaches    ____________________________________________   PHYSICAL EXAM:  VITAL SIGNS: ED  Triage Vitals  Enc Vitals Group     BP 03/15/2018 0911 95/73     Pulse Rate 04/01/2018 0911 (!) 113     Resp 03/03/2018 0911 17     Temp 03/07/2018 0916 97.7 F (36.5 C)     Temp Source 03/11/2018 0916 Oral     SpO2 03/13/2018 0906 94 %     Weight 03/08/2018 0917 68 kg (150 lb)     Height 03/20/2018 0917 1.676 m (5\' 6" )     Head Circumference --      Peak Flow --      Pain Score 03/17/2018 0917 7      Pain Loc --      Pain Edu? --      Excl. in [place]? --     Constitutional: Alert and oriented.  Cachectic, ill-appearing.Eyes: Conjunctivae are normal.   Nose: No congestion/rhinnorhea. Mouth/Throat: Mucous membranes are moist.    Cardiovascular: Normal rate, regular rhythm. Grossly normal heart sounds.  Good peripheral circulation. Respiratory: Normal respiratory effort.  No retractions.  Gastrointestinal: Tenderness palpation the left lower quadrant and right lower quadrant, mild distention, no CVA tenderness Genitourinary: deferred Musculoskeletal: 1+ edema bilaterally warm and well perfused Neurologic:  Normal speech and language. No gross focal neurologic deficits are appreciated.  Skin:  Skin is warm, dry and intact. No rash noted. Psychiatric: Mood and affect are normal. Speech and behavior are normal.  ____________________________________________   LABS (all labs ordered are listed, but only abnormal results are displayed)  Labs Reviewed  CBC WITH DIFFERENTIAL/PLATELET - Abnormal; Notable for the following components:      Result Value   RDW 15.7 (*)    Neutro Abs 7.4 (*)    Lymphs Abs 0.5 (*)    All other components within normal limits  COMPREHENSIVE METABOLIC PANEL - Abnormal; Notable for the following components:   Sodium 134 (*)    CO2 19 (*)    Glucose, Bld 110 (*)    BUN 54 (*)    Creatinine, Ser 2.29 (*)    Calcium 7.5 (*)    Total Protein 3.9 (*)    Albumin 1.7 (*)    AST 110 (*)    GFR calc non Af Amer 18 (*)    GFR calc Af Amer 21 (*)    All other components within normal limits  PROTIME-INR - Abnormal; Notable for the following components:   Prothrombin Time 35.6 (*)    All other components within normal limits  URINALYSIS, COMPLETE (UACMP) WITH MICROSCOPIC - Abnormal; Notable for the following components:   Color, Urine AMBER (*)    APPearance HAZY (*)    Nitrite POSITIVE (*)    Bacteria, UA MANY (*)    All other components within normal limits    PROTIME-INR - Abnormal; Notable for the following components:   Prothrombin Time 41.0 (*)    INR 4.31 (*)    All other components within normal limits  APTT - Abnormal; Notable for the following components:   aPTT 37 (*)    All other components within normal limits  LIPASE, BLOOD   ____________________________________________  EKG  ED ECG REPORT I, Lavonia Drafts, the attending physician, personally viewed and interpreted this ECG.  Date: 04/01/2018  Rhythm: Sinus tachycardia QRS Axis: normal Intervals: normal ST/T Wave abnormalities: normal   ____________________________________________  RADIOLOGY  CT abdomen pelvis without contrast shows pneumoperitoneum likely from paracentesis ____________________________________________   PROCEDURES  Procedure(s) performed: No  Procedures   Critical Care performed:  No ____________________________________________   INITIAL IMPRESSION / ASSESSMENT AND PLAN / ED COURSE  Pertinent labs & imaging results that were available during my care of the patient were reviewed by me and considered in my medical decision making (see chart for details).  Patient with a history of GI bleeds who presents with severe lower abdominal pain.  Differential includes cancer associated pain, obstruction.  Will obtain labs, CT imaging  CT will be performed without contrast as patient's GFR is 18  CT scan demonstrates pneumoperitoneum however recent paracentesis is likely the cause of this.  On reexam patient's abdominal pain is improved.  However she is still tachycardic.  Urinalysis is concerning for UTI, treated with IV Rocephin  Discussed with Dr. Grayland Ormond of oncology, will admit for further evaluation, fluids and antibiotics    ____________________________________________   FINAL CLINICAL IMPRESSION(S) / ED DIAGNOSES  Final diagnoses:  Lower urinary tract infectious disease  Lower abdominal pain        Note:  This document was  prepared using Dragon voice recognition software and may include unintentional dictation errors.    Lavonia Drafts, MD 03/07/2018 1401

## 2018-03-19 NOTE — ED Notes (Signed)
Date and time results received: 03/06/2018    Test: INR Critical Value: 4.31  Name of Provider Notified: kinner   Orders Received? Or Actions Taken?: MD notified

## 2018-03-20 ENCOUNTER — Inpatient Hospital Stay: Payer: Medicare Other | Admitting: Oncology

## 2018-03-20 ENCOUNTER — Encounter: Payer: Self-pay | Admitting: Oncology

## 2018-03-20 DIAGNOSIS — Z887 Allergy status to serum and vaccine status: Secondary | ICD-10-CM

## 2018-03-20 DIAGNOSIS — Z885 Allergy status to narcotic agent status: Secondary | ICD-10-CM

## 2018-03-20 DIAGNOSIS — Z66 Do not resuscitate: Secondary | ICD-10-CM

## 2018-03-20 DIAGNOSIS — K56609 Unspecified intestinal obstruction, unspecified as to partial versus complete obstruction: Secondary | ICD-10-CM

## 2018-03-20 DIAGNOSIS — C8590 Non-Hodgkin lymphoma, unspecified, unspecified site: Secondary | ICD-10-CM

## 2018-03-20 LAB — CYTOLOGY - NON PAP

## 2018-03-20 MED ORDER — LORAZEPAM 2 MG/ML IJ SOLN
1.0000 mg | INTRAMUSCULAR | Status: DC | PRN
  Administered 2018-03-20 – 2018-03-21 (×3): 1 mg via INTRAVENOUS
  Filled 2018-03-20 (×3): qty 1

## 2018-03-20 MED ORDER — SODIUM CHLORIDE 0.9 % IV SOLN: 250.0000 mL | INTRAVENOUS | Status: DC | PRN

## 2018-03-20 MED ORDER — HALOPERIDOL 0.5 MG PO TABS
0.5000 mg | ORAL_TABLET | ORAL | Status: DC | PRN
  Filled 2018-03-20: qty 1

## 2018-03-20 MED ORDER — FENTANYL 25 MCG/HR TD PT72
25.0000 ug | MEDICATED_PATCH | TRANSDERMAL | Status: DC
  Administered 2018-03-20: 25 ug via TRANSDERMAL
  Filled 2018-03-20: qty 1

## 2018-03-20 MED ORDER — MORPHINE SULFATE (PF) 2 MG/ML IV SOLN
2.0000 mg | INTRAVENOUS | Status: DC | PRN
  Administered 2018-03-20 (×2): 2 mg via INTRAVENOUS
  Filled 2018-03-20 (×2): qty 1

## 2018-03-20 MED ORDER — MORPHINE SULFATE (CONCENTRATE) 10 MG/0.5ML PO SOLN: 5.0000 mg | ORAL | Status: DC | PRN

## 2018-03-20 MED ORDER — MORPHINE SULFATE (PF) 4 MG/ML IV SOLN
3.0000 mg | INTRAVENOUS | Status: DC | PRN
  Administered 2018-03-20 (×2): 4 mg via INTRAVENOUS
  Administered 2018-03-21: 3 mg via INTRAVENOUS
  Administered 2018-03-21 (×2): 4 mg via INTRAVENOUS
  Filled 2018-03-20 (×5): qty 1

## 2018-03-20 MED ORDER — ONDANSETRON HCL 4 MG/2ML IJ SOLN: 4.0000 mg | Freq: Four times a day (QID) | INTRAMUSCULAR | Status: DC | PRN

## 2018-03-20 MED ORDER — NAPHAZOLINE-PHENIRAMINE 0.025-0.3 % OP SOLN
1.0000 [drp] | Freq: Four times a day (QID) | OPHTHALMIC | Status: DC | PRN
  Filled 2018-03-20: qty 5

## 2018-03-20 MED ORDER — ATROPINE SULFATE 1 % OP SOLN
4.0000 [drp] | OPHTHALMIC | Status: DC | PRN
  Filled 2018-03-20: qty 2

## 2018-03-20 MED ORDER — NAPHAZOLINE-GLYCERIN 0.012-0.2 % OP SOLN
1.0000 [drp] | OPHTHALMIC | Status: DC | PRN
  Filled 2018-03-20: qty 15

## 2018-03-20 MED ORDER — HALOPERIDOL LACTATE 2 MG/ML PO CONC
0.5000 mg | ORAL | Status: DC | PRN
  Filled 2018-03-20: qty 0.3

## 2018-03-20 MED ORDER — LORAZEPAM 1 MG PO TABS: 1.0000 mg | ORAL_TABLET | ORAL | Status: DC | PRN

## 2018-03-20 MED ORDER — LORAZEPAM 2 MG/ML PO CONC
1.0000 mg | ORAL | Status: DC | PRN
  Filled 2018-03-20: qty 0.5

## 2018-03-20 MED ORDER — SODIUM CHLORIDE 0.9% FLUSH
3.0000 mL | Freq: Two times a day (BID) | INTRAVENOUS | Status: DC
  Administered 2018-03-20 – 2018-03-21 (×3): 3 mL via INTRAVENOUS

## 2018-03-20 MED ORDER — SODIUM CHLORIDE 0.9% FLUSH
3.0000 mL | INTRAVENOUS | Status: DC | PRN
  Administered 2018-03-21: 3 mL via INTRAVENOUS
  Filled 2018-03-20: qty 3

## 2018-03-20 MED ORDER — ONDANSETRON 4 MG PO TBDP
4.0000 mg | ORAL_TABLET | Freq: Four times a day (QID) | ORAL | Status: DC | PRN
  Filled 2018-03-20 (×2): qty 1

## 2018-03-20 MED ORDER — HALOPERIDOL LACTATE 5 MG/ML IJ SOLN
0.5000 mg | INTRAMUSCULAR | Status: DC | PRN
  Filled 2018-03-20: qty 0.1

## 2018-03-20 NOTE — Consult Note (Signed)
[facility]  [phone]) [phone] [phone]) [phone]  ID: RAJVI ARMENTOR OB: 07/10/1931  MR#: [id number]  QZE#:[id number]  Patient Care Team: Ronnell Freshwater, NP as PCP - General (Family Medicine)  CHIEF COMPLAINT: Progressive high-grade lymphoma  INTERVAL HISTORY: Patient is an 84 year old female who was recently discharged from the hospital on Saturday.  Over the past 48 hours her performance status is significantly declined and is noted to have high-grade bowel obstruction as well as pneumoperitoneum. Patient is critically ill and review of systems is unobtainable.  Family at bedside who have chosen comfort care.  REVIEW OF SYSTEMS:   Review of Systems  Unable to perform ROS: Acuity of condition    PAST MEDICAL HISTORY: Past Medical History:  Diagnosis Date  . Allergy    Environmental  . GERD (gastroesophageal reflux disease)     PAST SURGICAL HISTORY: Past Surgical History:  Procedure Laterality Date  . ABDOMINAL HYSTERECTOMY    . APPENDECTOMY  1946  . Cataract Surgery    . ESOPHAGEAL DILATION    . TONSILLECTOMY    . TONSILLECTOMY Bilateral     FAMILY HISTORY: Family History  Problem Relation Age of Onset  . Stroke Father     ADVANCED DIRECTIVES (Y/N):  @ADVDIR @  HEALTH MAINTENANCE: Social History   Tobacco Use  . Smoking status: Never Smoker  . Smokeless tobacco: Never Used  Substance Use Topics  . Alcohol use: No  . Drug use: No     Colonoscopy:  PAP:  Bone density:  Lipid panel:  Allergies  Allergen Reactions  . Influenza Vaccine Live Other (See Comments)    Was told to never take again because of severe illness  . Codeine Rash    Current Facility-Administered Medications  Medication Dose Route Frequency Provider Last Rate Last Dose  . 0.9 %  sodium chloride infusion  250 mL Intravenous PRN Sudini, Alveta Heimlich, MD      . atropine 1 % ophthalmic solution 4 drop  4 drop Sublingual Q4H PRN Sudini, Srikar, MD      . fentaNYL  ([facility] - dosed mcg/hr) patch 25 mcg  25 mcg Transdermal Q72H Hillary Bow, MD   25 mcg at 03/20/18 1956  . haloperidol (HALDOL) tablet 0.5 mg  0.5 mg Oral Q4H PRN Sudini, Alveta Heimlich, MD       Or  . haloperidol (HALDOL) 2 MG/ML solution 0.5 mg  0.5 mg Sublingual Q4H PRN Sudini, Srikar, MD       Or  . haloperidol lactate (HALDOL) injection 0.5 mg  0.5 mg Intravenous Q4H PRN Sudini, Srikar, MD      . LORazepam (ATIVAN) tablet 1 mg  1 mg Oral Q4H PRN Hillary Bow, MD       Or  . LORazepam (ATIVAN) 2 MG/ML concentrated solution 1 mg  1 mg Sublingual Q4H PRN Hillary Bow, MD       Or  . LORazepam (ATIVAN) injection 1 mg  1 mg Intravenous Q4H PRN Hillary Bow, MD   1 mg at 03/20/18 1756  . morphine 4 MG/ML injection 3-4 mg  3-4 mg Intravenous Q2H PRN Nicholes Mango, MD   4 mg at 03/20/18 1819  . morphine CONCENTRATE 10 MG/0.5ML oral solution 5 mg  5 mg Oral Q2H PRN Sudini, Alveta Heimlich, MD       Or  . morphine CONCENTRATE 10 MG/0.5ML oral solution 5 mg  5 mg Sublingual Q2H PRN Sudini, Srikar, MD      . naphazoline-pheniramine (NAPHCON-A) 0.025-0.3 % ophthalmic solution  1 drop  1 drop Both Eyes QID PRN Sudini, Alveta Heimlich, MD      . ondansetron (ZOFRAN-ODT) disintegrating tablet 4 mg  4 mg Oral Q6H PRN Hillary Bow, MD       Or  . ondansetron (ZOFRAN) injection 4 mg  4 mg Intravenous Q6H PRN Sudini, Srikar, MD      . sodium chloride flush (NS) 0.9 % injection 3 mL  3 mL Intravenous Q12H Sudini, Srikar, MD   3 mL at 03/20/18 1130  . sodium chloride flush (NS) 0.9 % injection 3 mL  3 mL Intravenous PRN Hillary Bow, MD        OBJECTIVE: Vitals:   03/12/2018 2007 03/20/18 0412  BP: 91/66 (!) 83/69  Pulse: (!) 117 (!) 101  Resp: 17   Temp: 97.9 F (36.6 C) (!) 97.5 F (36.4 C)  SpO2: 95% 95%     Body mass index is 24.21 kg/m.    ECOG FS:4 - Bedbound  General: Thin, somnolent. Lungs: Clear to auscultation bilaterally. Heart: Regular rate and rhythm. No rubs, murmurs, or gallops. Abdomen: Soft,  nontender, nondistended. No organomegaly noted, normoactive bowel sounds. Musculoskeletal: No edema, cyanosis, or clubbing. Neuro: Somnolent, unarousable.   Skin: No rashes or petechiae noted. Psych: Somnolent.  LAB RESULTS:  Lab Results  Component Value Date   NA 134 (L) 03/26/2018   K 4.8 03/10/2018   CL 101 03/17/2018   CO2 19 (L) 03/04/2018   GLUCOSE 110 (H) 03/18/2018   BUN 54 (H) 03/05/2018   CREATININE 2.29 (H) 03/20/2018   CALCIUM 7.5 (L) 03/27/2018   PROT 3.9 (L) 03/06/2018   ALBUMIN 1.7 (L) 03/12/2018   AST 110 (H) 03/05/2018   ALT 34 03/16/2018   ALKPHOS 96 03/23/2018   BILITOT 0.9 03/29/2018   GFRNONAA 18 (L) 03/04/2018   GFRAA 21 (L) 03/30/2018    Lab Results  Component Value Date   WBC 8.3 03/10/2018   NEUTROABS 7.4 (H) 03/24/2018   HGB 14.1 03/04/2018   HCT 41.9 03/15/2018   MCV 90.8 03/17/2018   PLT 351 03/28/2018     STUDIES: Ct Abdomen Pelvis Wo Contrast  Result Date: 03/23/2018 CLINICAL DATA:  High-grade bowel obstruction EXAM: CT ABDOMEN AND PELVIS WITHOUT CONTRAST TECHNIQUE: Multidetector CT imaging of the abdomen and pelvis was performed following the standard protocol without IV contrast. COMPARISON:  CT 03/13/2018 FINDINGS: Lower chest: Small bilateral pleural effusions with bibasilar atelectasis, new since prior study. Heart is normal size. Hepatobiliary: Markedly distended gallbladder, stable. No visible focal hepatic abnormality. Pancreas: No focal abnormality or ductal dilatation. Spleen: No focal abnormality.  Normal size. Adrenals/Urinary Tract: Stable right adrenal calcifications. No left adrenal abnormality. No focal renal abnormality. No stones or hydronephrosis. Urinary bladder is unremarkable. Stomach/Bowel: Scattered left colonic diverticula. No active diverticulitis. Vascular/Lymphatic: Extensive mesenteric masses and adenopathy as seen on recent CT. The largest central mesenteric mass measures up to 4 cm compared to 3.7 cm, likely not  significantly changed. Aortic atherosclerosis. No aneurysm. Reproductive: Mixed solid and cystic pelvic mass again noted, not as well visualized with lack of intravenous contrast, measuring approximately 5 cm in greatest diameter, unchanged. Prior hysterectomy. Other: Moderate to large volume ascites in the abdomen or pelvis. There is free air noted anteriorly in the abdomen and elsewhere scattered throughout the abdomen and pelvis. This may be related to recent paracentesis. Musculoskeletal: No destructive bone lesion or acute bony abnormality. Scattered sclerotic foci, likely bone islands, stable. IMPRESSION: New pneumoperitoneum, question related to recent paracentesis. Recommend  clinical correlation for symptoms of possible bowel perforation. Large volume ascites. Small bilateral pleural effusions with bibasilar atelectasis. Mesenteric and omental masses again noted as well as the central to left pelvic solid and cystic mass, stable since prior study. Significant distention of the gallbladder, stable. Electronically Signed   By: Rolm Baptise M.D.   On: 03/12/2018 11:14   Ct Abdomen Pelvis Wo Contrast  Result Date: 03/13/2018 CLINICAL DATA:  84 year old with diarrhea for 4 weeks.  Weight loss. EXAM: CT ABDOMEN AND PELVIS WITHOUT CONTRAST TECHNIQUE: Multidetector CT imaging of the abdomen and pelvis was performed following the standard protocol without IV contrast. COMPARISON:  None. FINDINGS: Lower chest: No pleural fluid or focal consolidation. No basilar pulmonary nodule. Hepatobiliary: Lack of IV contrast limits assessment for focal lesion. Allowing for this, no focal hepatic lesion is visualized. Gallbladder is significantly distended without calcified gallstone. Common bile duct is normal in caliber measuring 7 mm. Pancreas: No ductal dilatation or inflammation. No obvious pancreatic mass on noncontrast exam. Spleen: Normal in size.  No evidence of focal abnormality. Adrenals/Urinary Tract: No adrenal  nodules. Right adrenal calcification may be sequela of prior hemorrhage. Bilateral renal parenchymal thinning. Prominence of bilateral renal collecting systems, left greater than right. No obstructing stone. Urinary bladder is partially distended. No obvious renal mass on noncontrast exam. Stomach/Bowel: No gastric wall thickening. No bowel obstruction. Enteric contrast is seen throughout the entire colon. Presence of intra-abdominal ascites limits assessment for bowel wall thickening. Allowing for this, no gross bowel wall thickening is seen. Appendix not visualized, patient with reported history of appendectomy. Vascular/Lymphatic: Prominent mesenteric nodes measuring up to 10 mm short axis. Mesenteric mass in the small bowel mesentery of the left abdomen measures 3.7 x 2.7 cm. Additional smaller mesenteric nodes are visualized. Small retroperitoneal nodes not enlarged by size criteria. Mild aortic atherosclerosis without aneurysm. Reproductive: Cystic structure in the left adnexa measures approximately 5 x 3.9 cm, questionable cystic structure in the right adnexa. Post hysterectomy. Other: Moderate volume abdominopelvic ascites. Diffuse mesenteric edema and small volume mesenteric ascites. There is fat stranding and soft tissue density in the anterior omentum primarily the lower abdomen and pelvis suspicious for omental caking. Mild body wall edema. No free air. Musculoskeletal: Scattered small sclerotic densities in the lumbar spine and pelvis are nonspecific but likely bone islands. No destructive lytic lesion. No acute osseous abnormality. IMPRESSION: 1. Findings suspicious for intra-abdominal malignancy with mesenteric mass in the small bowel mesentery, prominent mesenteric nodes, and soft tissue density in the anterior omentum concerning for omental caking. Moderate volume abdominopelvic ascites. Primary malignancy considerations include ovarian with cystic lesion in the left adnexa measuring 5 x 3.9 cm.  Carcinoid is also considered given reported history of diarrhea and mesenteric mass. Occult GI malignancy is also considered. Ascites sampling may be the least invasive target for sampling. Pelvic ultrasound could be considered for evaluation of cystic pelvic mass, however may be technically challenging. 2. Markedly distended gallbladder without biliary dilatation or calcified gallstone. 3. Prominence of bilateral renal collecting systems without frank hydronephrosis or urolithiasis. 4.  Aortic Atherosclerosis (ICD10-I70.0). Electronically Signed   By: Jeb Levering M.D.   On: 03/13/2018 03:30   Dg Chest 1 View  Addendum Date: 03/12/2018   ADDENDUM REPORT: 03/20/2018 19:15 ADDENDUM: Findings and suspicion of acute bowel perforation were discussed by telephone with Dr. Nicholes Mango on 03/27/2018 at 1908 hours. Electronically Signed   By: Genevie Ann M.D.   On: 03/31/2018 19:15   Result Date: 03/28/2018  CLINICAL DATA:  84 year old female with abdominal pain. Status post ultrasound-guided paracentesis on 03/14/2018 following CT demonstrating evidence of omental caking earlier this month. Pneumoperitoneum and ascites on CT this morning. EXAM: CHEST  1 VIEW COMPARISON:  CT Abdomen and Pelvis 1056 hours today and earlier. FINDINGS: AP upright view of the chest at 1838 hours. A moderate to large volume of free air beneath the diaphragm appears increased from the CT earlier today. Associated diaphragm elevation. Crowding of lung markings at the lung bases. No pneumothorax, pulmonary edema or consolidation. Small pleural effusions were better demonstrated by CT today. Mediastinal contours remain within normal limits. Visualized tracheal air column is within normal limits. Non-specific visible bowel gas pattern. IMPRESSION: 1. Moderate to large volume of pneumoperitoneum in the abdomen appears increased from the CT earlier today arguing in favor of acute bowel perforation. 2. Low lung volumes with atelectasis. Small  pleural effusions better demonstrated by CT earlier today. Electronically Signed: By: Genevie Ann M.D. On: 03/20/2018 19:00   Dg Chest 2 View  Result Date: 03/13/2018 CLINICAL DATA:  Lower leg edema EXAM: CHEST - 2 VIEW COMPARISON:  11/11/2015 FINDINGS: The heart size and mediastinal contours are within normal limits. Mild aortic atherosclerosis at the arch. No aneurysm identified. Both lungs are clear. No pulmonary vascular redistribution or CHF. Osteopenic appearance of the dorsal spine. IMPRESSION: No active cardiopulmonary disease. Electronically Signed   By: Ashley Royalty M.D.   On: 03/13/2018 02:01   US Venous Img Lower Bilateral  Result Date: 03/14/2018 CLINICAL DATA:  Bilateral lower extremity edema. EXAM: BILATERAL LOWER EXTREMITY VENOUS DOPPLER ULTRASOUND TECHNIQUE: [place]-scale sonography with graded compression, as well as color Doppler and duplex ultrasound were performed to evaluate the lower extremity deep venous systems from the level of the common femoral vein and including the common femoral, femoral, profunda femoral, popliteal and calf veins including the posterior tibial, peroneal and gastrocnemius veins when visible. The superficial great saphenous vein was also interrogated. Spectral Doppler was utilized to evaluate flow at rest and with distal augmentation maneuvers in the common femoral, femoral and popliteal veins. COMPARISON:  None. FINDINGS: RIGHT LOWER EXTREMITY Common Femoral Vein: No evidence of thrombus. Normal compressibility, respiratory phasicity and response to augmentation. Saphenofemoral Junction: No evidence of thrombus. Normal compressibility and flow on color Doppler imaging. Profunda Femoral Vein: No evidence of thrombus. Normal compressibility and flow on color Doppler imaging. Femoral Vein: No evidence of thrombus. Normal compressibility, respiratory phasicity and response to augmentation. Popliteal Vein: No evidence of thrombus. Normal compressibility, respiratory  phasicity and response to augmentation. Calf Veins: No evidence of thrombus. Normal compressibility and flow on color Doppler imaging. Superficial Great Saphenous Vein: No evidence of thrombus. Normal compressibility. Venous Reflux:  None. Other Findings: No evidence of superficial thrombophlebitis or abnormal fluid collection. LEFT LOWER EXTREMITY Common Femoral Vein: No evidence of thrombus. Normal compressibility, respiratory phasicity and response to augmentation. Saphenofemoral Junction: No evidence of thrombus. Normal compressibility and flow on color Doppler imaging. Profunda Femoral Vein: No evidence of thrombus. Normal compressibility and flow on color Doppler imaging. Femoral Vein: No evidence of thrombus. Normal compressibility, respiratory phasicity and response to augmentation. Popliteal Vein: No evidence of thrombus. Normal compressibility, respiratory phasicity and response to augmentation. Calf Veins: No evidence of thrombus. Normal compressibility and flow on color Doppler imaging. Superficial Great Saphenous Vein: No evidence of thrombus. Normal compressibility. Venous Reflux:  None. Other Findings: No evidence of superficial thrombophlebitis or abnormal fluid collection. IMPRESSION: No evidence of bilateral lower extremity deep  venous thrombosis. Electronically Signed   By: Aletta Edouard M.D.   On: 03/14/2018 12:43   US Paracentesis  Result Date: 03/14/2018 INDICATION: Ascites, abdominal distension EXAM: ULTRASOUND GUIDED RIGHT PARACENTESIS MEDICATIONS: 1% LIDOCAINE LOCAL COMPLICATIONS: None immediate. PROCEDURE: Informed written consent was obtained from the patient after a discussion of the risks, benefits and alternatives to treatment. A timeout was performed prior to the initiation of the procedure. Initial ultrasound scanning demonstrates a large amount of ascites within the right UPPER abdominal quadrant. The right lower abdomen was prepped and draped in the usual sterile fashion. 1%  lidocaine with epinephrine was used for local anesthesia. Following this, a 6 Fr Safe-T-Centesis catheter was introduced. An ultrasound image was saved for documentation purposes. The paracentesis was performed. The catheter was removed and a dressing was applied. The patient tolerated the procedure well without immediate post procedural complication. FINDINGS: A total of approximately 1 L of serosanguineous peritoneal fluid was removed. Samples were sent to the laboratory as requested by the clinical team. IMPRESSION: Successful ultrasound-guided paracentesis yielding 1.0 liters of peritoneal fluid. Electronically Signed   By: Jerilynn Mages.  Shick M.D.   On: 03/14/2018 12:37   Dg Abd Portable 2v  Result Date: 03/22/2018 CLINICAL DATA:  84 year old female with abdominal pain, abnormal CT Abdomen and Pelvis and radiographs demonstrating pneumoperitoneum today. EXAM: PORTABLE ABDOMEN - 2 VIEW COMPARISON:  Portable chest radiograph 1838 hours today and earlier. FINDINGS: Upright and supine views of the abdomen and pelvis. Moderate to large volume of pneumoperitoneum redemonstrated, including beneath the entire surface of the diaphragm. This once again seems increased from the volume of free air demonstrated by CT at 1056 hours today. No superimposed dilated small or large bowel loop is identified in the abdomen. Choinski hazy opacity in the abdomen corresponds to ascites on the CT earlier today. No acute osseous abnormality identified. IMPRESSION: 1. Moderate to large volume of pneumoperitoneum redemonstrated which seems increased from the CT at 1056 hours today. This was previously discussed with Dr. Nicholes Mango by telephone at 1908 hours today. 2. Superimposed ascites.  No dilated bowel is evident. Electronically Signed   By: Genevie Ann M.D.   On: 03/25/2018 19:46    ASSESSMENT: Progressive high-grade lymphoma  PLAN:    1. Progressive high-grade lymphoma: Possibly T-cell lymphoma.  Patient's disease appears rapidly  progressive and her performance status has significantly declined in the past 48 to 72 hours.  Patient's family have chosen to pursue comfort care.  No intervention is needed.  Patient is DNR/DNI.  Appreciate consult.   Lloyd Huger, MD   03/20/2018 10:15 PM

## 2018-03-20 NOTE — Progress Notes (Signed)
[facility] NAME: Jill Wilson    MR#:  [id number]  DATE OF BIRTH:  09/05/1931  SUBJECTIVE:  CHIEF COMPLAINT:   Chief Complaint  Patient presents with  . Abdominal Pain   Drowzy Receiving pain meds  REVIEW OF SYSTEMS:    Review of Systems  Unable to perform ROS: Mental status change    DRUG ALLERGIES:   Allergies  Allergen Reactions  . Influenza Vaccine Live Other (See Comments)    Was told to never take again because of severe illness  . Codeine Rash    VITALS:  Blood pressure (!) 83/69, pulse (!) 101, temperature (!) 97.5 F (36.4 C), temperature source Oral, resp. rate 17, height 5\' 6"  (1.676 m), weight 68 kg (150 lb), SpO2 95 %.  PHYSICAL EXAMINATION:   Physical Exam  GENERAL:  84 y.o.-year-old patient lying in the bed with no acute distress. Pale EYES: Pupils equal, round, reactive to light and accommodation. No scleral icterus. Extraocular muscles intact.  HEENT: Head atraumatic, normocephalic. Oropharynx and nasopharynx clear.  NECK:  Supple, no jugular venous distention. No thyroid enlargement, no tenderness.  LUNGS: Decreased breath sounds CARDIOVASCULAR: S1, S2 normal. No murmurs, rubs, or gallops.  ABDOMEN: distended. rigid EXTREMITIES: No cyanosis, clubbing or edema b/l.    PSYCHIATRIC: The patient is drowzy SKIN: No obvious rash, lesion, or ulcer.   LABORATORY PANEL:   CBC Recent Labs  Lab 03/26/2018 0920  WBC 8.3  HGB 14.1  HCT 41.9  PLT 351   ------------------------------------------------------------------------------------------------------------------ Chemistries  Recent Labs  Lab 03/16/18 0424  03/27/2018 0920  NA 135   < > 134*  K 3.8   < > 4.8  CL 103   < > 101  CO2 25   < > 19*  GLUCOSE 91   < > 110*  BUN 26*   < > 54*  CREATININE 1.34*   < > 2.29*  CALCIUM 7.1*   < > 7.5*  MG 1.8  --   --   AST  --   --  110*  ALT  --   --  34  ALKPHOS  --   --  96  BILITOT  --   --  0.9    < > = values in this interval not displayed.   ------------------------------------------------------------------------------------------------------------------  Cardiac Enzymes No results for input(s): TROPONINI in the last 168 hours. ------------------------------------------------------------------------------------------------------------------  RADIOLOGY:  Ct Abdomen Pelvis Wo Contrast  Result Date: 03/06/2018 CLINICAL DATA:  High-grade bowel obstruction EXAM: CT ABDOMEN AND PELVIS WITHOUT CONTRAST TECHNIQUE: Multidetector CT imaging of the abdomen and pelvis was performed following the standard protocol without IV contrast. COMPARISON:  CT 03/13/2018 FINDINGS: Lower chest: Small bilateral pleural effusions with bibasilar atelectasis, new since prior study. Heart is normal size. Hepatobiliary: Markedly distended gallbladder, stable. No visible focal hepatic abnormality. Pancreas: No focal abnormality or ductal dilatation. Spleen: No focal abnormality.  Normal size. Adrenals/Urinary Tract: Stable right adrenal calcifications. No left adrenal abnormality. No focal renal abnormality. No stones or hydronephrosis. Urinary bladder is unremarkable. Stomach/Bowel: Scattered left colonic diverticula. No active diverticulitis. Vascular/Lymphatic: Extensive mesenteric masses and adenopathy as seen on recent CT. The largest central mesenteric mass measures up to 4 cm compared to 3.7 cm, likely not significantly changed. Aortic atherosclerosis. No aneurysm. Reproductive: Mixed solid and cystic pelvic mass again noted, not as well visualized with lack of intravenous contrast, measuring approximately 5 cm in greatest diameter, unchanged. Prior hysterectomy. Other: Moderate to large  volume ascites in the abdomen or pelvis. There is free air noted anteriorly in the abdomen and elsewhere scattered throughout the abdomen and pelvis. This may be related to recent paracentesis. Musculoskeletal: No destructive bone  lesion or acute bony abnormality. Scattered sclerotic foci, likely bone islands, stable. IMPRESSION: New pneumoperitoneum, question related to recent paracentesis. Recommend clinical correlation for symptoms of possible bowel perforation. Large volume ascites. Small bilateral pleural effusions with bibasilar atelectasis. Mesenteric and omental masses again noted as well as the central to left pelvic solid and cystic mass, stable since prior study. Significant distention of the gallbladder, stable. Electronically Signed   By: Rolm Baptise M.D.   On: 03/20/2018 11:14   Dg Chest 1 View  Addendum Date: 03/15/2018   ADDENDUM REPORT: 03/04/2018 19:15 ADDENDUM: Findings and suspicion of acute bowel perforation were discussed by telephone with Dr. Nicholes Mango on 03/28/2018 at 1908 hours. Electronically Signed   By: Genevie Ann M.D.   On: 03/10/2018 19:15   Result Date: 03/09/2018 CLINICAL DATA:  84 year old female with abdominal pain. Status post ultrasound-guided paracentesis on 03/14/2018 following CT demonstrating evidence of omental caking earlier this month. Pneumoperitoneum and ascites on CT this morning. EXAM: CHEST  1 VIEW COMPARISON:  CT Abdomen and Pelvis 1056 hours today and earlier. FINDINGS: AP upright view of the chest at 1838 hours. A moderate to large volume of free air beneath the diaphragm appears increased from the CT earlier today. Associated diaphragm elevation. Crowding of lung markings at the lung bases. No pneumothorax, pulmonary edema or consolidation. Small pleural effusions were better demonstrated by CT today. Mediastinal contours remain within normal limits. Visualized tracheal air column is within normal limits. Non-specific visible bowel gas pattern. IMPRESSION: 1. Moderate to large volume of pneumoperitoneum in the abdomen appears increased from the CT earlier today arguing in favor of acute bowel perforation. 2. Low lung volumes with atelectasis. Small pleural effusions better demonstrated  by CT earlier today. Electronically Signed: By: Genevie Ann M.D. On: 03/30/2018 19:00   Dg Abd Portable 2v  Result Date: 03/20/2018 CLINICAL DATA:  84 year old female with abdominal pain, abnormal CT Abdomen and Pelvis and radiographs demonstrating pneumoperitoneum today. EXAM: PORTABLE ABDOMEN - 2 VIEW COMPARISON:  Portable chest radiograph 1838 hours today and earlier. FINDINGS: Upright and supine views of the abdomen and pelvis. Moderate to large volume of pneumoperitoneum redemonstrated, including beneath the entire surface of the diaphragm. This once again seems increased from the volume of free air demonstrated by CT at 1056 hours today. No superimposed dilated small or large bowel loop is identified in the abdomen. Bazaldua hazy opacity in the abdomen corresponds to ascites on the CT earlier today. No acute osseous abnormality identified. IMPRESSION: 1. Moderate to large volume of pneumoperitoneum redemonstrated which seems increased from the CT at 1056 hours today. This was previously discussed with Dr. Nicholes Mango by telephone at 1908 hours today. 2. Superimposed ascites.  No dilated bowel is evident. Electronically Signed   By: Genevie Ann M.D.   On: 03/03/2018 19:46     ASSESSMENT AND PLAN:   #Acute encephalopathy from acute cystitis Monitor patient closely Hydrate with IV fluids and get neurochecks  #Acute cystitis Urine culture and sensitivity and IV Rocephin  # Bowel perforation with abdominal pain No plans for surgery due to multiple co morbidities   #Acute kidney injury IVF  #Supratherapeutic INR at 4.31 No active bleeding noticed    All the records are reviewed and case discussed with Care Management/Social Worker Management  plans discussed with the patient, family and they are in agreement.  CODE STATUS: DNR  DVT Prophylaxis: SCDs  TOTAL TIME TAKING CARE OF THIS PATIENT: 25 minutes.   Neita Carp M.D on 03/20/2018 at 11:52 AM  Between 7am to 6pm - Pager -  [phone]  After 6pm go to [email or web address] - password [email or web address] [facility]  Office  [phone]  CC: Primary care physician; Ronnell Freshwater, NP  Note: This dictation was prepared with Dragon dictation along with smaller phrase technology. Any transcriptional errors that result from this process are unintentional.

## 2018-03-20 NOTE — Progress Notes (Signed)
Initial Nutrition Assessment  DOCUMENTATION CODES:   Severe malnutrition in context of chronic illness  INTERVENTION:   RD will monitor for diet advancement vs the need for nutrition support pending GOC  NUTRITION DIAGNOSIS:   Severe Malnutrition related to cancer and cancer related treatments, other (see comment)(advanced age) as evidenced by severe muscle depletion, severe fat depletion.  GOAL:   Patient will meet greater than or equal to 90% of their needs  MONITOR:   Diet advancement, Labs, Weight trends, Skin, I & O's  REASON FOR ASSESSMENT:   Malnutrition Screening Tool    ASSESSMENT:    84 y.o. female with a known history of GERD, recent diagnosis of high-grade lymphoma of the GI tract,  just discharged from the hospital on June 15 with a discharge diagnosis of diarrhea, severe protein energy malnutrition is presenting to the ED with acute abdominal pain.  Pt found to have acute abdomen    Pt discharged 6/15 from [facility]; was eating <25% of meals upon discharge and family reports that pt has been unable to eat anything since r/t abdominal pain. Pt currently NPO. Pt at high refeeding risk. Surgery service following. RD will monitor for diet advancement vs the need for nutrition support pending GOC.   Medications reviewed and include: colace, ceftriaxone   Labs reviewed: Na 134(L), BUN 54(H), creat 7.5(H), Ca 7.5(L) adj. 9.34 wnl, alb 1.7(L)- 6/17 P 3.4 wnl, Mg 1.8 wnl- 6/14  NUTRITION - FOCUSED PHYSICAL EXAM:    Most Recent Value  Orbital Region  Severe depletion  Upper Arm Region  Severe depletion  Thoracic and Lumbar Region  Severe depletion  Buccal Region  Severe depletion  Temple Region  Severe depletion  Clavicle Bone Region  Severe depletion  Clavicle and Acromion Bone Region  Severe depletion  Scapular Bone Region  Severe depletion  Dorsal Hand  Severe depletion  Patellar Region  Unable to assess  Anterior Thigh Region  Unable to assess  Posterior Calf  Region  Unable to assess  Edema (RD Assessment)  Severe  Hair  Reviewed  Eyes  Reviewed  Mouth  Reviewed  Skin  Reviewed  Nails  Reviewed     Diet Order:   Diet Order           Diet NPO time specified  Diet effective midnight         EDUCATION NEEDS:   No education needs have been identified at this time  Skin:  Skin Assessment: Reviewed RN Assessment  Last BM:  6/17  Height:   Ht Readings from Last 1 Encounters:  03/22/2018 5\' 6"  (1.676 m)    Weight:   Wt Readings from Last 1 Encounters:  03/09/2018 150 lb (68 kg)    Ideal Body Weight:  59 kg  BMI:  Body mass index is 24.21 kg/m.  Estimated Nutritional Needs:   Kcal:  1500-1700kcal/day   Protein:  80-95g/day   Fluid:  >1.5L/day   Koleen Distance MS, RD, LDN Pager #- [phone] Office#- [phone] After Hours Pager: [phone]

## 2018-03-20 NOTE — Care Management (Signed)
Patient admitted from home with acute encephalopathy, and was found to have free air in abdomen.  Surgery has been consulted.  Patient lives at home alone.  Was discharged from this facility on 6.15 with home health services through [facility], and a RW from [facility].

## 2018-03-20 NOTE — Plan of Care (Signed)
The patient has been placed on comfort measures per family agreeing. Patient is not progressing relating to her health status. Patient being transported to 1c.

## 2018-03-20 NOTE — Progress Notes (Addendum)
Advance care planning  Met with patients son and daughter at bedside. Other family members present. Patient was drowsy and unable to participate in discussion. Also has received multiple doses of morphine for pain control.  Patient has not improved since yesterday needing multiple pain medications. Seen by surgery and not thought to be a surgical candidate due to advanced aggressive lymphoma and multiple comorbidities.  We discussed regarding patient being do not resuscitate and with aggressive lymphoma not being a surgical candidate. Also significant worsening since yesterday. Discussed regarding transitioning to comfort measures. Family agrees. Orders entered. Patient being transferred to 1C.  Time spent 18 minutes

## 2018-03-20 NOTE — Progress Notes (Signed)
Patient placed on comfort care and will be transferred to 1C. Report given to Kyla Balzarine, RN whom will be the patient's nurse on 1C. Family is aware that patient is being transferred to another unit. Patient currently sleeping with several family members at bedside.

## 2018-03-21 MED ORDER — MORPHINE 100MG IN NS 100ML (1MG/ML) PREMIX INFUSION
2.0000 mg/h | INTRAVENOUS | Status: DC
Start: 2018-03-21 — End: 2018-03-21
  Administered 2018-03-21: 08:00:00 2 mg/h via INTRAVENOUS
  Filled 2018-03-21: qty 100

## 2018-03-22 LAB — URINE CULTURE: Culture: 20000 — AB

## 2018-04-02 NOTE — Plan of Care (Signed)
Patient is experiencing abnormal breathing patterns, glassy eyes, low urine out and perspiring. Ativan, morphine, and eye drops administered for comfort. Patient's family at bedside. Comfort cart in patient's room . Will continue to monitor and endorse.

## 2018-04-02 NOTE — Progress Notes (Signed)
[facility] [place] NAME: Jill Wilson    MR#:  [id number]  DATE OF BIRTH:  1931/06/23  SUBJECTIVE:  CHIEF COMPLAINT:   Chief Complaint  Patient presents with  . Abdominal Pain   patient is unresponsive. Family at bedside.  REVIEW OF SYSTEMS:    Review of Systems  Unable to perform ROS: Patient unresponsive    DRUG ALLERGIES:   Allergies  Allergen Reactions  . Influenza Vaccine Live Other (See Comments)    Was told to never take again because of severe illness  . Codeine Rash    VITALS:  Blood pressure (!) 83/69, pulse (!) 116, temperature (!) 97.5 F (36.4 C), temperature source Oral, resp. rate 17, height 5\' 6"  (1.676 m), weight 68 kg (150 lb), SpO2 90 %.  PHYSICAL EXAMINATION:   Physical Exam  Laying in bed. Unresponsive. Glassy eyes  LABORATORY PANEL:   CBC Recent Labs  Lab 03/05/2018 0920  WBC 8.3  HGB 14.1  HCT 41.9  PLT 351   ------------------------------------------------------------------------------------------------------------------ Chemistries  Recent Labs  Lab 03/16/18 0424  03/06/2018 0920  NA 135   < > 134*  K 3.8   < > 4.8  CL 103   < > 101  CO2 25   < > 19*  GLUCOSE 91   < > 110*  BUN 26*   < > 54*  CREATININE 1.34*   < > 2.29*  CALCIUM 7.1*   < > 7.5*  MG 1.8  --   --   AST  --   --  110*  ALT  --   --  34  ALKPHOS  --   --  96  BILITOT  --   --  0.9   < > = values in this interval not displayed.   ------------------------------------------------------------------------------------------------------------------  Cardiac Enzymes No results for input(s): TROPONINI in the last 168 hours. ------------------------------------------------------------------------------------------------------------------  RADIOLOGY:  Dg Chest 1 View  Addendum Date: 03/28/2018   ADDENDUM REPORT: 03/08/2018 19:15 ADDENDUM: Findings and suspicion of acute bowel perforation were discussed by telephone with Dr.  Nicholes Mango on 04/01/2018 at 1908 hours. Electronically Signed   By: Genevie Ann M.D.   On: 03/25/2018 19:15   Result Date: 03/20/2018 CLINICAL DATA:  84 year old female with abdominal pain. Status post ultrasound-guided paracentesis on 03/14/2018 following CT demonstrating evidence of omental caking earlier this month. Pneumoperitoneum and ascites on CT this morning. EXAM: CHEST  1 VIEW COMPARISON:  CT Abdomen and Pelvis 1056 hours today and earlier. FINDINGS: AP upright view of the chest at 1838 hours. A moderate to large volume of free air beneath the diaphragm appears increased from the CT earlier today. Associated diaphragm elevation. Crowding of lung markings at the lung bases. No pneumothorax, pulmonary edema or consolidation. Small pleural effusions were better demonstrated by CT today. Mediastinal contours remain within normal limits. Visualized tracheal air column is within normal limits. Non-specific visible bowel gas pattern. IMPRESSION: 1. Moderate to large volume of pneumoperitoneum in the abdomen appears increased from the CT earlier today arguing in favor of acute bowel perforation. 2. Low lung volumes with atelectasis. Small pleural effusions better demonstrated by CT earlier today. Electronically Signed: By: Genevie Ann M.D. On: 03/26/2018 19:00   Dg Abd Portable 2v  Result Date: 03/09/2018 CLINICAL DATA:  84 year old female with abdominal pain, abnormal CT Abdomen and Pelvis and radiographs demonstrating pneumoperitoneum today. EXAM: PORTABLE ABDOMEN - 2 VIEW COMPARISON:  Portable chest radiograph 1838 hours today  and earlier. FINDINGS: Upright and supine views of the abdomen and pelvis. Moderate to large volume of pneumoperitoneum redemonstrated, including beneath the entire surface of the diaphragm. This once again seems increased from the volume of free air demonstrated by CT at 1056 hours today. No superimposed dilated small or large bowel loop is identified in the abdomen. Sam hazy opacity in  the abdomen corresponds to ascites on the CT earlier today. No acute osseous abnormality identified. IMPRESSION: 1. Moderate to large volume of pneumoperitoneum redemonstrated which seems increased from the CT at 1056 hours today. This was previously discussed with Dr. Nicholes Mango by telephone at 1908 hours today. 2. Superimposed ascites.  No dilated bowel is evident. Electronically Signed   By: Genevie Ann M.D.   On: 03/22/2018 19:46     ASSESSMENT AND PLAN:   #Acute encephalopathy from acute cystitis #Acute cystitis # Bowel perforation with abdominal pain #Acute kidney injury #Supratherapeutic INR at 4.31 #T-cell lymphoma #severe protein calorie malnutrition #acute kidney injury  Change to morphine drip for comfort. Likely not survive more than a few hours.  All the records are reviewed and case discussed with Care Management/Social Workerr. Management plans discussed with the patient, family and they are in agreement.  CODE STATUS: DNR  TOTAL TIME TAKING CARE OF THIS PATIENT: 25 minutes. >50% time spent in discussing with family and RN co-ordinating care   Neita Carp M.D on 04-14-2018 at 11:15 AM  Between 7am to 6pm - Pager - [phone]  After 6pm go to [email or web address] - password [email or web address] [facility]  Office  [phone]  CC: Primary care physician; Ronnell Freshwater, NP  Note: This dictation was prepared with Dragon dictation along with smaller phrase technology. Any transcriptional errors that result from this process are unintentional.

## 2018-04-02 NOTE — Death Summary Note (Signed)
DEATH SUMMARY   Patient Details  Name: Jill Wilson MRN: [id number] DOB: [birth year]  Admission/Discharge Information   Admit Date:  04-13-2018  Date of Death: Date of Death: Apr 15, 2018  Time of Death: Time of Death: 01-21-1114  Length of Stay: 2  Referring Physician: Ronnell Freshwater, NP   Reason(s) for Hospitalization  Abdominal pain  Diagnoses  Preliminary cause of death:   Bowel perforation secondary to lymphoma  Secondary Diagnoses (including complications and co-morbidities):  Active Problems:   Acute cystitis   Acute abdominal pain  #Acute encephalopathy from acute cystitis #Acute cystitis #Bowel perforation with abdominal pain #Acute kidney injury #Supratherapeutic INR at 4.31 #T-cell lymphoma #severe protein calorie malnutrition #acute kidney injury  Brief Hospital Course (including significant findings, care, treatment, and services provided and events leading to death)  Jill Wilson is a 84 y.o. year old female who was recently diagnosed with intra-abdominal malignancy of unknown kind. This was later found to be aggressive form of lymphoma and possibly T-cell lymphoma. Patient presented to the emergency room with acutely worsening abdominal pain. Was found to have peritoneum. This could have been due to malignancy or recent paracentesis. Patient was admitted to medical floor. Seen by surgery and patient was not thought to be a surgical candidate. She deteriorated significantly in the first few hours in spite of aggressive management. After discussing with family patient was transition to comfort care. Started on morphine drip. Patient was pronounced dead on 04/15/2018 at 11.15 AM.  Pertinent Labs and Studies  Significant Diagnostic Studies Ct Abdomen Pelvis Wo Contrast  Result Date: April 13, 2018 CLINICAL DATA:  High-grade bowel obstruction EXAM: CT ABDOMEN AND PELVIS WITHOUT CONTRAST TECHNIQUE: Multidetector CT imaging of the abdomen and pelvis was performed following the  standard protocol without IV contrast. COMPARISON:  CT 03/13/2018 FINDINGS: Lower chest: Small bilateral pleural effusions with bibasilar atelectasis, new since prior study. Heart is normal size. Hepatobiliary: Markedly distended gallbladder, stable. No visible focal hepatic abnormality. Pancreas: No focal abnormality or ductal dilatation. Spleen: No focal abnormality.  Normal size. Adrenals/Urinary Tract: Stable right adrenal calcifications. No left adrenal abnormality. No focal renal abnormality. No stones or hydronephrosis. Urinary bladder is unremarkable. Stomach/Bowel: Scattered left colonic diverticula. No active diverticulitis. Vascular/Lymphatic: Extensive mesenteric masses and adenopathy as seen on recent CT. The largest central mesenteric mass measures up to 4 cm compared to 3.7 cm, likely not significantly changed. Aortic atherosclerosis. No aneurysm. Reproductive: Mixed solid and cystic pelvic mass again noted, not as well visualized with lack of intravenous contrast, measuring approximately 5 cm in greatest diameter, unchanged. Prior hysterectomy. Other: Moderate to large volume ascites in the abdomen or pelvis. There is free air noted anteriorly in the abdomen and elsewhere scattered throughout the abdomen and pelvis. This may be related to recent paracentesis. Musculoskeletal: No destructive bone lesion or acute bony abnormality. Scattered sclerotic foci, likely bone islands, stable. IMPRESSION: New pneumoperitoneum, question related to recent paracentesis. Recommend clinical correlation for symptoms of possible bowel perforation. Large volume ascites. Small bilateral pleural effusions with bibasilar atelectasis. Mesenteric and omental masses again noted as well as the central to left pelvic solid and cystic mass, stable since prior study. Significant distention of the gallbladder, stable. Electronically Signed   By: Rolm Baptise M.D.   On: April 13, 2018 11:14   Ct Abdomen Pelvis Wo Contrast  Result  Date: 03/13/2018 CLINICAL DATA:  84 year old with diarrhea for 4 weeks.  Weight loss. EXAM: CT ABDOMEN AND PELVIS WITHOUT CONTRAST TECHNIQUE: Multidetector CT imaging of the abdomen  and pelvis was performed following the standard protocol without IV contrast. COMPARISON:  None. FINDINGS: Lower chest: No pleural fluid or focal consolidation. No basilar pulmonary nodule. Hepatobiliary: Lack of IV contrast limits assessment for focal lesion. Allowing for this, no focal hepatic lesion is visualized. Gallbladder is significantly distended without calcified gallstone. Common bile duct is normal in caliber measuring 7 mm. Pancreas: No ductal dilatation or inflammation. No obvious pancreatic mass on noncontrast exam. Spleen: Normal in size.  No evidence of focal abnormality. Adrenals/Urinary Tract: No adrenal nodules. Right adrenal calcification may be sequela of prior hemorrhage. Bilateral renal parenchymal thinning. Prominence of bilateral renal collecting systems, left greater than right. No obstructing stone. Urinary bladder is partially distended. No obvious renal mass on noncontrast exam. Stomach/Bowel: No gastric wall thickening. No bowel obstruction. Enteric contrast is seen throughout the entire colon. Presence of intra-abdominal ascites limits assessment for bowel wall thickening. Allowing for this, no gross bowel wall thickening is seen. Appendix not visualized, patient with reported history of appendectomy. Vascular/Lymphatic: Prominent mesenteric nodes measuring up to 10 mm short axis. Mesenteric mass in the small bowel mesentery of the left abdomen measures 3.7 x 2.7 cm. Additional smaller mesenteric nodes are visualized. Small retroperitoneal nodes not enlarged by size criteria. Mild aortic atherosclerosis without aneurysm. Reproductive: Cystic structure in the left adnexa measures approximately 5 x 3.9 cm, questionable cystic structure in the right adnexa. Post hysterectomy. Other: Moderate volume  abdominopelvic ascites. Diffuse mesenteric edema and small volume mesenteric ascites. There is fat stranding and soft tissue density in the anterior omentum primarily the lower abdomen and pelvis suspicious for omental caking. Mild body wall edema. No free air. Musculoskeletal: Scattered small sclerotic densities in the lumbar spine and pelvis are nonspecific but likely bone islands. No destructive lytic lesion. No acute osseous abnormality. IMPRESSION: 1. Findings suspicious for intra-abdominal malignancy with mesenteric mass in the small bowel mesentery, prominent mesenteric nodes, and soft tissue density in the anterior omentum concerning for omental caking. Moderate volume abdominopelvic ascites. Primary malignancy considerations include ovarian with cystic lesion in the left adnexa measuring 5 x 3.9 cm. Carcinoid is also considered given reported history of diarrhea and mesenteric mass. Occult GI malignancy is also considered. Ascites sampling may be the least invasive target for sampling. Pelvic ultrasound could be considered for evaluation of cystic pelvic mass, however may be technically challenging. 2. Markedly distended gallbladder without biliary dilatation or calcified gallstone. 3. Prominence of bilateral renal collecting systems without frank hydronephrosis or urolithiasis. 4.  Aortic Atherosclerosis (ICD10-I70.0). Electronically Signed   By: Jeb Levering M.D.   On: 03/13/2018 03:30   Dg Chest 1 View  Addendum Date: 03/08/2018   ADDENDUM REPORT: 03/24/2018 19:15 ADDENDUM: Findings and suspicion of acute bowel perforation were discussed by telephone with Dr. Nicholes Mango on 03/03/2018 at 1908 hours. Electronically Signed   By: Genevie Ann M.D.   On: 03/16/2018 19:15   Result Date: 03/25/2018 CLINICAL DATA:  84 year old female with abdominal pain. Status post ultrasound-guided paracentesis on 03/14/2018 following CT demonstrating evidence of omental caking earlier this month. Pneumoperitoneum and  ascites on CT this morning. EXAM: CHEST  1 VIEW COMPARISON:  CT Abdomen and Pelvis 1056 hours today and earlier. FINDINGS: AP upright view of the chest at 1838 hours. A moderate to large volume of free air beneath the diaphragm appears increased from the CT earlier today. Associated diaphragm elevation. Crowding of lung markings at the lung bases. No pneumothorax, pulmonary edema or consolidation. Small pleural effusions were better  demonstrated by CT today. Mediastinal contours remain within normal limits. Visualized tracheal air column is within normal limits. Non-specific visible bowel gas pattern. IMPRESSION: 1. Moderate to large volume of pneumoperitoneum in the abdomen appears increased from the CT earlier today arguing in favor of acute bowel perforation. 2. Low lung volumes with atelectasis. Small pleural effusions better demonstrated by CT earlier today. Electronically Signed: By: Genevie Ann M.D. On: 03/05/2018 19:00   Dg Chest 2 View  Result Date: 03/13/2018 CLINICAL DATA:  Lower leg edema EXAM: CHEST - 2 VIEW COMPARISON:  11/11/2015 FINDINGS: The heart size and mediastinal contours are within normal limits. Mild aortic atherosclerosis at the arch. No aneurysm identified. Both lungs are clear. No pulmonary vascular redistribution or CHF. Osteopenic appearance of the dorsal spine. IMPRESSION: No active cardiopulmonary disease. Electronically Signed   By: Ashley Royalty M.D.   On: 03/13/2018 02:01   US Venous Img Lower Bilateral  Result Date: 03/14/2018 CLINICAL DATA:  Bilateral lower extremity edema. EXAM: BILATERAL LOWER EXTREMITY VENOUS DOPPLER ULTRASOUND TECHNIQUE: Cragin-scale sonography with graded compression, as well as color Doppler and duplex ultrasound were performed to evaluate the lower extremity deep venous systems from the level of the common femoral vein and including the common femoral, femoral, profunda femoral, popliteal and calf veins including the posterior tibial, peroneal and  gastrocnemius veins when visible. The superficial great saphenous vein was also interrogated. Spectral Doppler was utilized to evaluate flow at rest and with distal augmentation maneuvers in the common femoral, femoral and popliteal veins. COMPARISON:  None. FINDINGS: RIGHT LOWER EXTREMITY Common Femoral Vein: No evidence of thrombus. Normal compressibility, respiratory phasicity and response to augmentation. Saphenofemoral Junction: No evidence of thrombus. Normal compressibility and flow on color Doppler imaging. Profunda Femoral Vein: No evidence of thrombus. Normal compressibility and flow on color Doppler imaging. Femoral Vein: No evidence of thrombus. Normal compressibility, respiratory phasicity and response to augmentation. Popliteal Vein: No evidence of thrombus. Normal compressibility, respiratory phasicity and response to augmentation. Calf Veins: No evidence of thrombus. Normal compressibility and flow on color Doppler imaging. Superficial Great Saphenous Vein: No evidence of thrombus. Normal compressibility. Venous Reflux:  None. Other Findings: No evidence of superficial thrombophlebitis or abnormal fluid collection. LEFT LOWER EXTREMITY Common Femoral Vein: No evidence of thrombus. Normal compressibility, respiratory phasicity and response to augmentation. Saphenofemoral Junction: No evidence of thrombus. Normal compressibility and flow on color Doppler imaging. Profunda Femoral Vein: No evidence of thrombus. Normal compressibility and flow on color Doppler imaging. Femoral Vein: No evidence of thrombus. Normal compressibility, respiratory phasicity and response to augmentation. Popliteal Vein: No evidence of thrombus. Normal compressibility, respiratory phasicity and response to augmentation. Calf Veins: No evidence of thrombus. Normal compressibility and flow on color Doppler imaging. Superficial Great Saphenous Vein: No evidence of thrombus. Normal compressibility. Venous Reflux:  None. Other  Findings: No evidence of superficial thrombophlebitis or abnormal fluid collection. IMPRESSION: No evidence of bilateral lower extremity deep venous thrombosis. Electronically Signed   By: Aletta Edouard M.D.   On: 03/14/2018 12:43   US Paracentesis  Result Date: 03/14/2018 INDICATION: Ascites, abdominal distension EXAM: ULTRASOUND GUIDED RIGHT PARACENTESIS MEDICATIONS: 1% LIDOCAINE LOCAL COMPLICATIONS: None immediate. PROCEDURE: Informed written consent was obtained from the patient after a discussion of the risks, benefits and alternatives to treatment. A timeout was performed prior to the initiation of the procedure. Initial ultrasound scanning demonstrates a large amount of ascites within the right UPPER abdominal quadrant. The right lower abdomen was prepped and draped in the usual  sterile fashion. 1% lidocaine with epinephrine was used for local anesthesia. Following this, a 6 Fr Safe-T-Centesis catheter was introduced. An ultrasound image was saved for documentation purposes. The paracentesis was performed. The catheter was removed and a dressing was applied. The patient tolerated the procedure well without immediate post procedural complication. FINDINGS: A total of approximately 1 L of serosanguineous peritoneal fluid was removed. Samples were sent to the laboratory as requested by the clinical team. IMPRESSION: Successful ultrasound-guided paracentesis yielding 1.0 liters of peritoneal fluid. Electronically Signed   By: Jerilynn Mages.  Shick M.D.   On: 03/14/2018 12:37   Dg Abd Portable 2v  Result Date: 03/20/2018 CLINICAL DATA:  85 year old female with abdominal pain, abnormal CT Abdomen and Pelvis and radiographs demonstrating pneumoperitoneum today. EXAM: PORTABLE ABDOMEN - 2 VIEW COMPARISON:  Portable chest radiograph 1838 hours today and earlier. FINDINGS: Upright and supine views of the abdomen and pelvis. Moderate to large volume of pneumoperitoneum redemonstrated, including beneath the entire surface  of the diaphragm. This once again seems increased from the volume of free air demonstrated by CT at 1056 hours today. No superimposed dilated small or large bowel loop is identified in the abdomen. Goettl hazy opacity in the abdomen corresponds to ascites on the CT earlier today. No acute osseous abnormality identified. IMPRESSION: 1. Moderate to large volume of pneumoperitoneum redemonstrated which seems increased from the CT at 1056 hours today. This was previously discussed with Dr. Nicholes Mango by telephone at 1908 hours today. 2. Superimposed ascites.  No dilated bowel is evident. Electronically Signed   By: Genevie Ann M.D.   On: 03/29/2018 19:46    Microbiology Recent Results (from the past 240 hour(s))  C difficile quick scan w PCR reflex     Status: None   Collection Time: 03/13/18 12:41 AM  Result Value Ref Range Status   C Diff antigen NEGATIVE NEGATIVE Final   C Diff toxin NEGATIVE NEGATIVE Final   C Diff interpretation No C. difficile detected.  Final    Comment: Performed at [facility], [address] [place], [place] [address]  Gastrointestinal Panel by PCR , Stool     Status: None   Collection Time: 03/13/18 12:41 AM  Result Value Ref Range Status   Campylobacter species NOT DETECTED NOT DETECTED Final   Plesimonas shigelloides NOT DETECTED NOT DETECTED Final   Salmonella species NOT DETECTED NOT DETECTED Final   Yersinia enterocolitica NOT DETECTED NOT DETECTED Final   Vibrio species NOT DETECTED NOT DETECTED Final   Vibrio cholerae NOT DETECTED NOT DETECTED Final   Enteroaggregative E coli (EAEC) NOT DETECTED NOT DETECTED Final   Enteropathogenic E coli (EPEC) NOT DETECTED NOT DETECTED Final   Enterotoxigenic E coli (ETEC) NOT DETECTED NOT DETECTED Final   Shiga like toxin producing E coli (STEC) NOT DETECTED NOT DETECTED Final   Shigella/Enteroinvasive E coli (EIEC) NOT DETECTED NOT DETECTED Final   Cryptosporidium NOT DETECTED NOT DETECTED Final   Cyclospora  cayetanensis NOT DETECTED NOT DETECTED Final   Entamoeba histolytica NOT DETECTED NOT DETECTED Final   Giardia lamblia NOT DETECTED NOT DETECTED Final   Adenovirus F40/41 NOT DETECTED NOT DETECTED Final   Astrovirus NOT DETECTED NOT DETECTED Final   Norovirus GI/GII NOT DETECTED NOT DETECTED Final   Rotavirus A NOT DETECTED NOT DETECTED Final   Sapovirus (I, II, IV, and V) NOT DETECTED NOT DETECTED Final    Comment: Performed at [facility], [address] [place], [place] [address]  Group A Strep  by PCR     Status: None   Collection Time: 03/15/18  9:25 AM  Result Value Ref Range Status   Group A Strep by PCR NOT DETECTED NOT DETECTED Final    Comment: Performed at [facility], [address], [place], [place] [address]  Urine culture     Status: Abnormal (Preliminary result)   Collection Time: 03/10/2018  2:11 PM  Result Value Ref Range Status   Specimen Description   Final    URINE, CATHETERIZED Performed at [facility], [address] [place], [place] [address]    Special Requests   Final    NONE Performed at [facility], [address], [place], [place] [address]    Culture (A)  Final    20,000 COLONIES/mL KLEBSIELLA PNEUMONIAE SUSCEPTIBILITIES TO FOLLOW Performed at [facility], [place] [address], [place], [place] [address]    Report Status PENDING  Incomplete    Lab Basic Metabolic Panel: Recent Labs  Lab 03/14/18 1915 03/15/18 0443 03/16/18 0424 03/17/18 0506 03/28/2018 0920  NA 135 135 135 134* 134*  K 3.5 4.2 3.8 3.9 4.8  CL 108 111 103 102 101  CO2 18* 18* 25 22 19*  GLUCOSE 130* 107* 91 110* 110*  BUN 23* 21* 26* 37* 54*  CREATININE 1.45* 1.20* 1.34* 1.91* 2.29*  CALCIUM 7.3* 7.2* 7.1* 7.3* 7.5*  MG 1.9 1.8 1.8  --   --   PHOS 3.8 3.3 3.4  --   --    Liver Function Tests: Recent Labs  Lab 03/31/2018 0920  AST 110*  ALT 34  ALKPHOS 96  BILITOT 0.9  PROT 3.9*  ALBUMIN 1.7*   Recent Labs  Lab  03/16/2018 0920  LIPASE 37   No results for input(s): AMMONIA in the last 168 hours. CBC: Recent Labs  Lab 03/12/2018 0920  WBC 8.3  NEUTROABS 7.4*  HGB 14.1  HCT 41.9  MCV 90.8  PLT 351   Cardiac Enzymes: No results for input(s): CKTOTAL, CKMB, CKMBINDEX, TROPONINI in the last 168 hours. Sepsis Labs: Recent Labs  Lab 03/18/2018 0920  WBC 8.3    Procedures/Operations  None   Asta Corbridge R Talise Sligh 04-15-18, 1:25 PM

## 2018-04-02 NOTE — Progress Notes (Signed)
   04-06-18 1135  Clinical Encounter Type  Visited With Family  Visit Type Death  Referral From Nurse  Consult/Referral To Chaplain  Spiritual Encounters  Spiritual Needs Emotional;Grief support   Ch was alerted by on-call [employer] that PT had died. [place] reported to PT's RM and spoke with family and gave condolence to them. Family spoke of the great lady the PT was. 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 [place] informed family that he would be available if needed and take the time they need with PT.

## 2018-04-02 DEATH — deceased

## 2018-06-19 ENCOUNTER — Encounter: Payer: Self-pay | Admitting: Internal Medicine

## 2019-04-24 IMAGING — CT CT ABD-PELV W/O CM
2 of 4 series · 15 of 46 positions shown, 17 images · non-contrast
Comparison: CT 03/13/2018

CLINICAL DATA: High-grade bowel obstruction

EXAM:
CT ABDOMEN AND PELVIS WITHOUT CONTRAST
TECHNIQUE: Multidetector CT imaging of the abdomen and pelvis was performed
following the standard protocol without IV contrast.

[Series 2: routine abd/pel wo · axial · 0.70mm/px · z∈[-926,-486]mm · 12 of 102 slices shown, 14 images]
[im 9/102  soft-tissue]
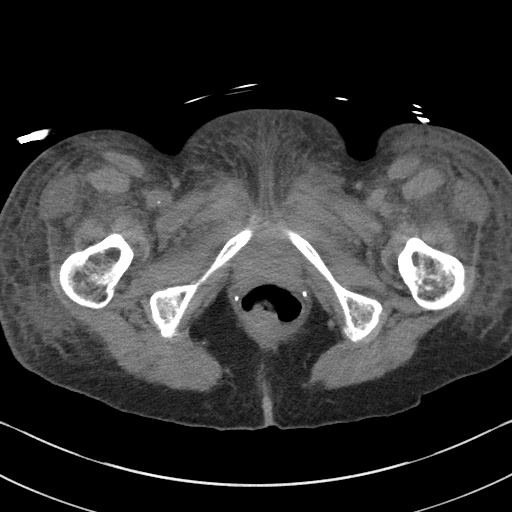
[im 9/102  bone]
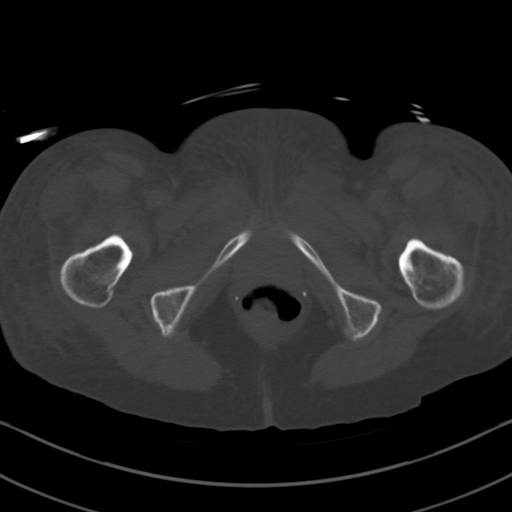
[im 17/102  soft-tissue]
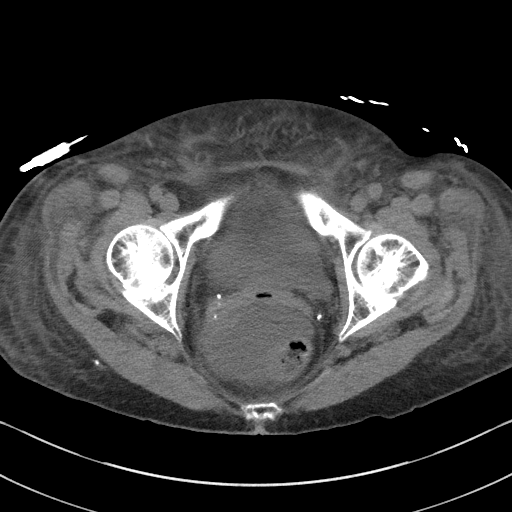
[im 25/102  soft-tissue]
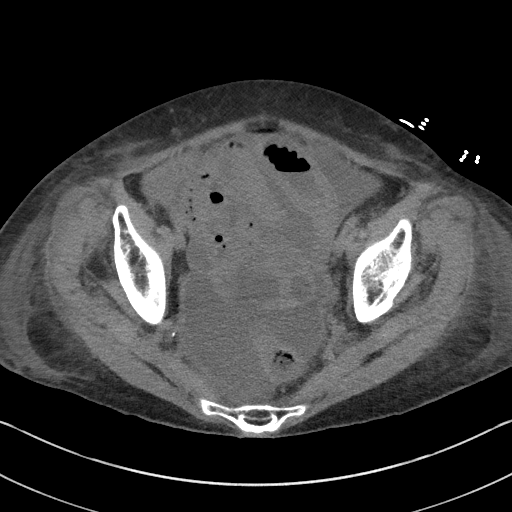
[im 33/102  soft-tissue]
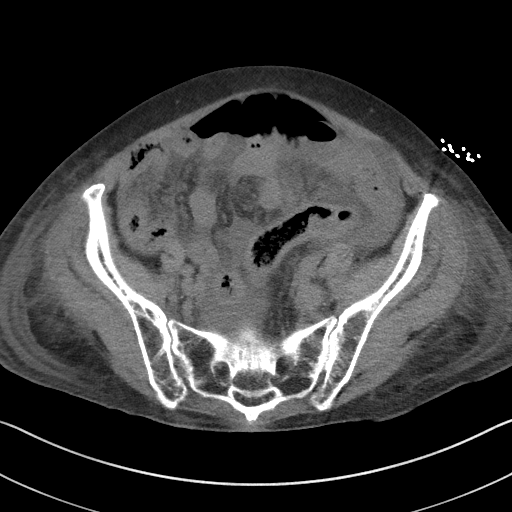
[im 41/102  soft-tissue]
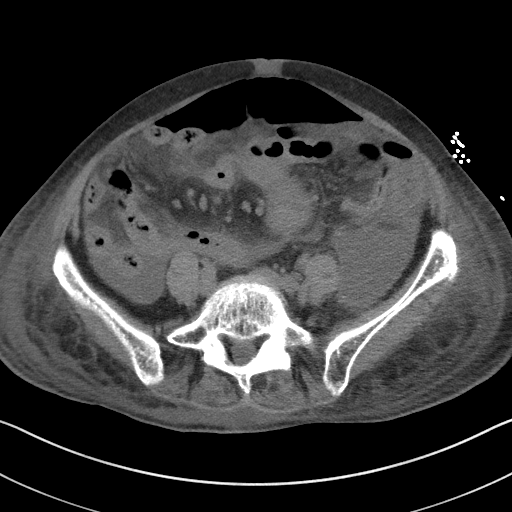
[im 49/102  soft-tissue]
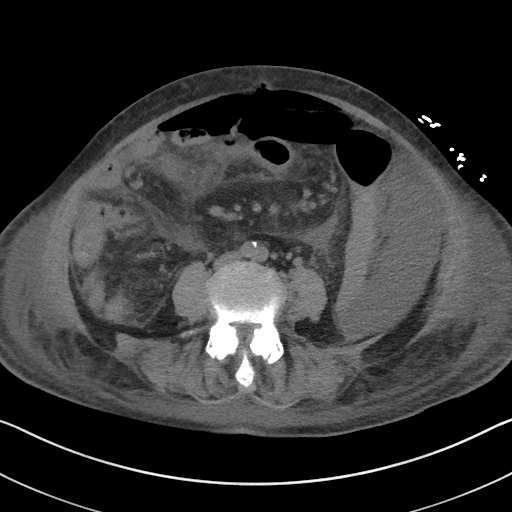
[im 57/102  soft-tissue]
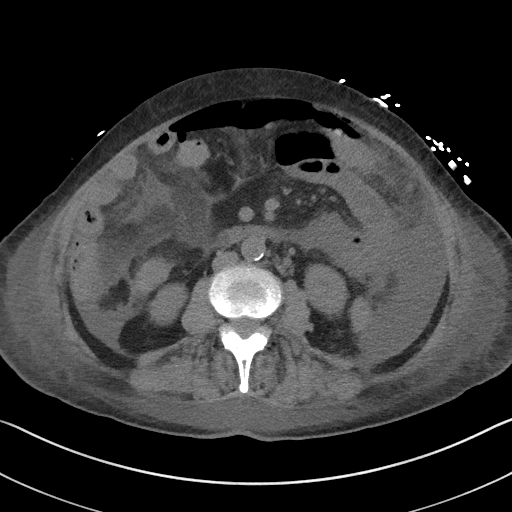
[im 65/102  soft-tissue]
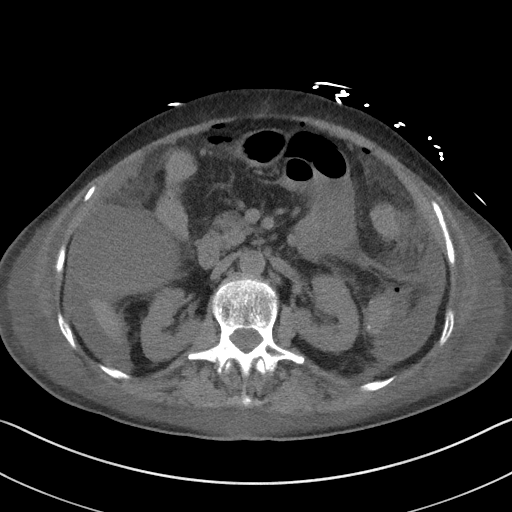
[im 73/102  soft-tissue]
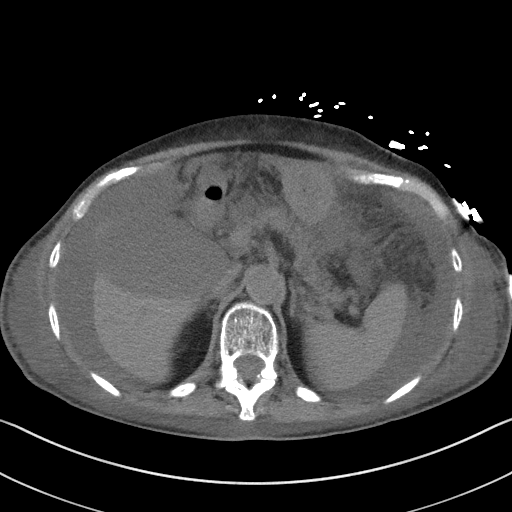
[im 73/102  bone]
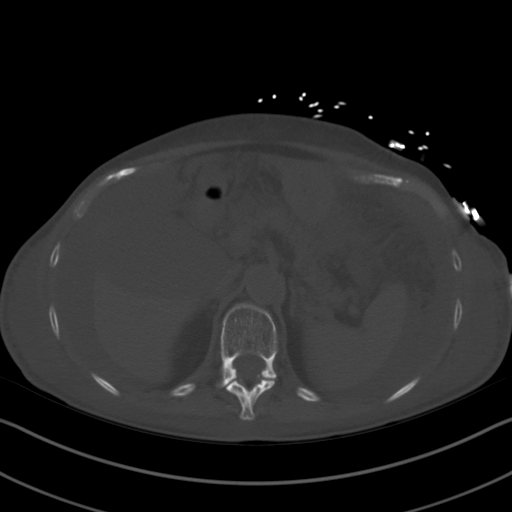
[im 81/102  soft-tissue]
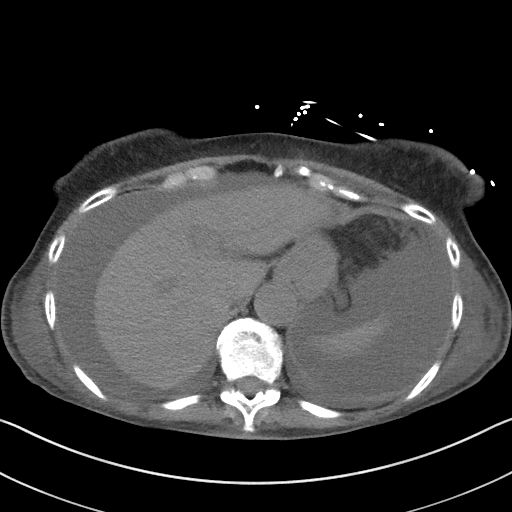
[im 89/102  soft-tissue]
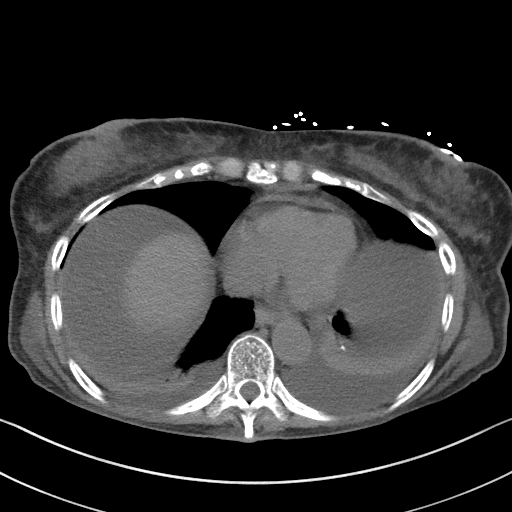
[im 97/102  soft-tissue]
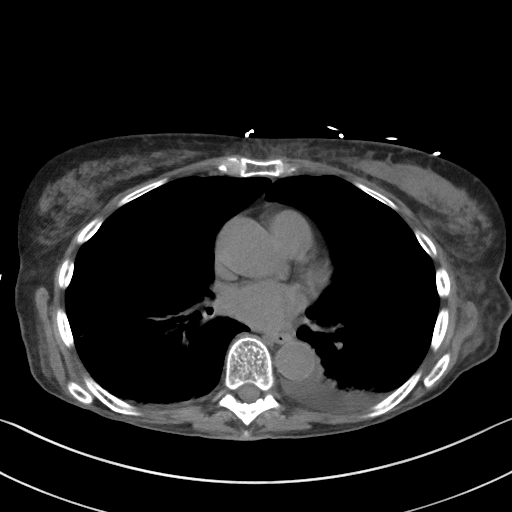

[Series 5: coronal st · coronal · 0.72mm/px · 3 of 90 slices shown]
[im 30/90  soft-tissue]
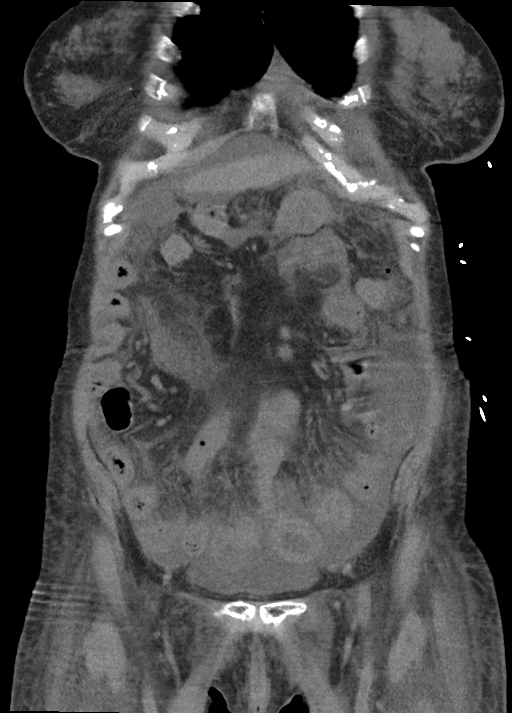
[im 40/90  soft-tissue]
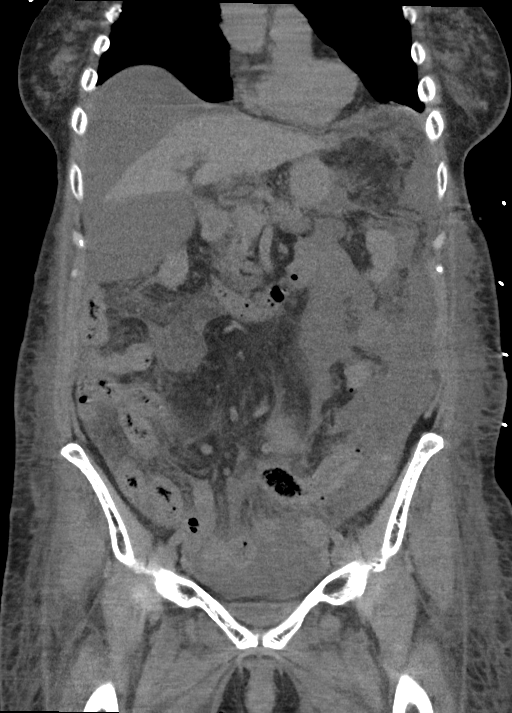
[im 50/90  soft-tissue]
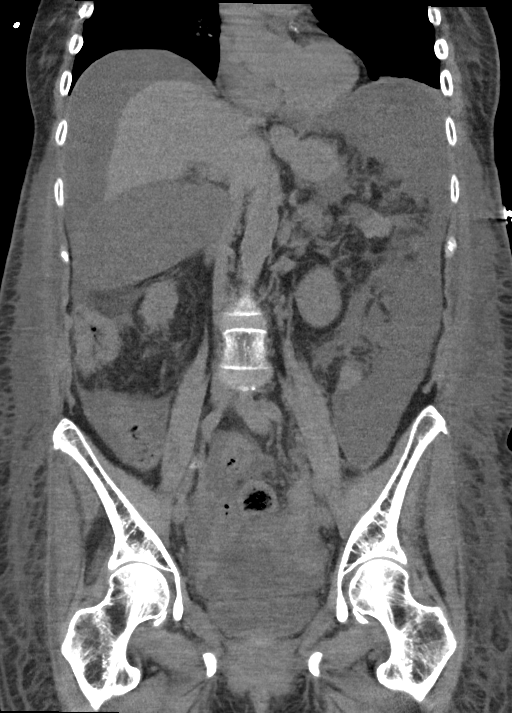

[15 of 46 positions shown; findings below may reference images not displayed]

FINDINGS: Lower chest: Small bilateral pleural effusions with bibasilar
atelectasis, new since prior study. Heart is normal size.

Hepatobiliary: Markedly distended gallbladder, stable. No visible
focal hepatic abnormality.

Pancreas: No focal abnormality or ductal dilatation.

Spleen: No focal abnormality.  Normal size.

Adrenals/Urinary Tract: Stable right adrenal calcifications. No left
adrenal abnormality. No focal renal abnormality. No stones or
hydronephrosis. Urinary bladder is unremarkable.

Stomach/Bowel: Scattered left colonic diverticula. No active
diverticulitis.

Vascular/Lymphatic: Extensive mesenteric masses and adenopathy as
seen on recent CT. The largest central mesenteric mass measures up
to 4 cm compared to 3.7 cm, likely not significantly changed. Aortic
atherosclerosis. No aneurysm.

Reproductive: Mixed solid and cystic pelvic mass again noted, not as
well visualized with lack of intravenous contrast, measuring
approximately 5 cm in greatest diameter, unchanged. Prior
hysterectomy.

Other: Moderate to large volume ascites in the abdomen or pelvis.
There is free air noted anteriorly in the abdomen and elsewhere
scattered throughout the abdomen and pelvis. This may be related to
recent paracentesis.

Musculoskeletal: No destructive bone lesion or acute bony
abnormality. Scattered sclerotic foci, likely bone islands, stable.
IMPRESSION: New pneumoperitoneum, question related to recent paracentesis.
Recommend clinical correlation for symptoms of possible bowel
perforation.

Large volume ascites. Small bilateral pleural effusions with
bibasilar atelectasis.

Mesenteric and omental masses again noted as well as the central to
left pelvic solid and cystic mass, stable since prior study.

Significant distention of the gallbladder, stable.
